# Patient Record
Sex: Female | Born: 1935 | Race: White | Hispanic: No | Marital: Married | Smoking: Former smoker
Health system: Southern US, Community
[De-identification: ages and names within clinical notes are randomized; demographics above are authoritative.]

## PROBLEM LIST (undated history)

## (undated) DIAGNOSIS — I4891 Unspecified atrial fibrillation: Secondary | ICD-10-CM

## (undated) DIAGNOSIS — I82409 Acute embolism and thrombosis of unspecified deep veins of unspecified lower extremity: Secondary | ICD-10-CM

## (undated) HISTORY — PX: OTHER SURGICAL HISTORY: SHX169

## (undated) HISTORY — PX: APPENDECTOMY: SHX54

## (undated) HISTORY — PX: BREAST SURGERY: SHX581

---

## 2014-07-07 DIAGNOSIS — E785 Hyperlipidemia, unspecified: Secondary | ICD-10-CM | POA: Diagnosis present

## 2014-07-07 DIAGNOSIS — K219 Gastro-esophageal reflux disease without esophagitis: Secondary | ICD-10-CM | POA: Diagnosis present

## 2014-07-07 DIAGNOSIS — M5416 Radiculopathy, lumbar region: Secondary | ICD-10-CM | POA: Diagnosis present

## 2014-10-04 DIAGNOSIS — J4489 Other specified chronic obstructive pulmonary disease: Secondary | ICD-10-CM | POA: Diagnosis present

## 2015-10-14 ENCOUNTER — Emergency Department (HOSPITAL_BASED_OUTPATIENT_CLINIC_OR_DEPARTMENT_OTHER)
Admission: EM | Admit: 2015-10-14 | Discharge: 2015-10-14 | Disposition: A | Discharge status: Home / Self Care | Payer: Medicare Other | Attending: Emergency Medicine | Admitting: Emergency Medicine

## 2015-10-14 ENCOUNTER — Ambulatory Visit (HOSPITAL_BASED_OUTPATIENT_CLINIC_OR_DEPARTMENT_OTHER)
Admission: RE | Admit: 2015-10-14 | Discharge: 2015-10-14 | Disposition: A | Discharge status: Home / Self Care | Payer: Medicare Other | Source: Ambulatory Visit | Attending: Emergency Medicine | Admitting: Emergency Medicine

## 2015-10-14 ENCOUNTER — Encounter (HOSPITAL_BASED_OUTPATIENT_CLINIC_OR_DEPARTMENT_OTHER): Payer: Self-pay | Admitting: *Deleted

## 2015-10-14 DIAGNOSIS — I82411 Acute embolism and thrombosis of right femoral vein: Secondary | ICD-10-CM | POA: Insufficient documentation

## 2015-10-14 DIAGNOSIS — Z86718 Personal history of other venous thrombosis and embolism: Secondary | ICD-10-CM

## 2015-10-14 DIAGNOSIS — I82431 Acute embolism and thrombosis of right popliteal vein: Secondary | ICD-10-CM | POA: Insufficient documentation

## 2015-10-14 DIAGNOSIS — M79661 Pain in right lower leg: Secondary | ICD-10-CM

## 2015-10-14 DIAGNOSIS — M79604 Pain in right leg: Secondary | ICD-10-CM | POA: Insufficient documentation

## 2015-10-14 DIAGNOSIS — Z87891 Personal history of nicotine dependence: Secondary | ICD-10-CM | POA: Insufficient documentation

## 2015-10-14 DIAGNOSIS — I82811 Embolism and thrombosis of superficial veins of right lower extremities: Secondary | ICD-10-CM

## 2015-10-14 HISTORY — DX: Acute embolism and thrombosis of unspecified deep veins of unspecified lower extremity: I82.409

## 2015-10-14 MED ORDER — XARELTO VTE STARTER PACK 15 & 20 MG PO TBPK: 15.0000 mg | ORAL_TABLET | ORAL | Status: DC

## 2015-10-14 NOTE — ED Notes (Signed)
Presents with 1 week pain in rt leg, states has hx of DVT in left lower extremity

## 2015-10-14 NOTE — ED Provider Notes (Signed)
CSN: [id number]     Arrival date & time 10/14/15  [id number] History   First MD Initiated Contact with Patient 10/14/15 [phone]     Chief Complaint  Patient presents with  . Leg Pain     (Consider location/radiation/quality/duration/timing/severity/associated sxs/prior Treatment) HPI  80 year old female presents with right calf pain and swelling that started 8 days ago. She feels knots in her leg. Patient states she has felt mild warmth. Denies fevers or chills. No chest pain or shortness of breath. The area seems to be improving but she went to go see the doctor at her facility and they recommended she come get a Doppler to rule out DVT. Years ago she had an arterial blockage in her left leg but this feels way different and not nearly as severe. She accepts thinks that one of the knots has gone down and is gone but she wanted to get it checked out. No weakness or numbness in the extremity.  Past Medical History  Diagnosis Date  . DVT (deep venous thrombosis) [facility](HCC)    Past Surgical History  Procedure Laterality Date  . Spleenectomy    . Appendectomy    . Breast surgery     History reviewed. No pertinent family history. Social History  Substance Use Topics  . Smoking status: Former Games developermoker  . Smokeless tobacco: None  . Alcohol Use: 1.2 oz/week    2 Glasses of wine per week     Comment: 5 days a week   OB History    No data available     Review of Systems  Constitutional: Negative for fever.  Respiratory: Negative for shortness of breath.   Cardiovascular: Positive for leg swelling. Negative for chest pain.  Skin: Positive for color change.  All other systems reviewed and are negative.     Allergies  Codeine  Home Medications   Prior to Admission medications   Not on File   BP 124/66 mmHg  Pulse 76  Temp(Src) 97.2 F (36.2 C) (Oral)  Resp 18  Ht 5\' 5"  (1.651 m)  Wt 132 lb (59.875 kg)  BMI 21.97 kg/m2  SpO2 98% Physical Exam  Constitutional: She is oriented to  person, place, and time. She appears well-developed and well-nourished.  HENT:  Head: Normocephalic and atraumatic.  Right Ear: External ear normal.  Left Ear: External ear normal.  Nose: Nose normal.  Eyes: Right eye exhibits no discharge. Left eye exhibits no discharge.  Cardiovascular: Normal rate and intact distal pulses.   Pulses:      Dorsalis pedis pulses are 2+ on the right side, and 2+ on the left side.       Posterior tibial pulses are 2+ on the right side, and 2+ on the left side.  Pulmonary/Chest: Effort normal.  Abdominal: She exhibits no distension.  Musculoskeletal:       Right knee: No tenderness found.       Right upper leg: She exhibits no tenderness.       Right lower leg: She exhibits swelling.       Legs: Normal strength and sensation in bilateral lower extremities. No tenderness to knee or thigh  Neurological: She is alert and oriented to person, place, and time.  Skin: Skin is warm and dry.  Nursing note and vitals reviewed.   ED Course  Procedures (including critical care time) Labs Review Labs Reviewed - No data to display  Imaging Review No results found. I have personally reviewed and evaluated these images and lab  results as part of my medical decision-making.   EKG Interpretation None      MDM   Final diagnoses:  Right calf pain    Patient symptoms are most consistent with a superficial thrombophlebitis. Given improvement with no treatment I have low suspicion this is a cellulitis. There appears to be no abscess. She will need an ultrasound rule out DVT. At the time of seeing her, ultrasound is not come in to this department until 3 hours from now. Patient prefers not to wait, and will return at around 12:30 PM to get her ultrasound to rule out DVT. No chest symptoms to be concerned for a PE. Pulses normal, normal perfusion.    Pricilla Loveless, MD 10/14/15 [phone]

## 2015-10-14 NOTE — ED Provider Notes (Signed)
Received a call from the radiologist that the patient's DVT ultrasound does show a DVT in the popliteal and profunda femoral veins. Patient still does not have chest pain when I'm talking to her. She relates no known history of hypertension, renal failure, stroke, recent surgery or spinal surgery or history of significant bleeding. I discussed risks and benefits of blood thinners. She does want to be placed on a blood thinner, will start her on Xarelto. Discussed importance of following up closely with her PCP. Discussed that her bleeding will last longer and that she is high risk for intracranial injury or bleeding as well as other bleeding. Discussed any head injury needs to be evaluated by a doctor and she still prefers to do the blood thinner. Discussed other return precautions and have given her prescription for her medicine.   No results found for this or any previous visit. [place] Venous Img Lower Unilateral Right  10/14/2015  CLINICAL DATA:  Right lower extremity pain. History of prior deep venous thrombosis on left side EXAM: RIGHT LOWER EXTREMITY VENOUS DUPLEX ULTRASOUND TECHNIQUE: Gray-scale sonography with graded compression, as well as color Doppler and duplex ultrasound were performed to evaluate the right lower extremity deep venous system from the level of the common femoral vein and including the common femoral, femoral, profunda femoral, popliteal and calf veins including the posterior tibial, peroneal and gastrocnemius veins when visible. The superficial great saphenous vein was also interrogated. Spectral Doppler was utilized to evaluate flow at rest and with distal augmentation maneuvers in the common femoral, femoral and popliteal veins. COMPARISON:  None. FINDINGS: Contralateral Common Femoral Vein: Respiratory phasicity is normal and symmetric with the symptomatic side. No evidence of thrombus. Normal compressibility. Common Femoral Vein: No evidence of thrombus. Normal compressibility,  respiratory phasicity and response to augmentation. Saphenofemoral Junction: No evidence of thrombus. Normal compressibility and flow on color Doppler imaging. Profunda Femoral Vein: There is acute appearing thrombus in the right profunda femoral vein. There is no compression or augmentation in this vessel. There is no demonstrable Doppler signal. Femoral Vein: No evidence of thrombus. Normal compressibility, respiratory phasicity and response to augmentation. Popliteal Vein: There is acute appearing thrombus throughout this vessel. There is no appreciable Doppler signal. No compression and augmentation in this vessel is appreciated. Calf Veins: No evidence of thrombus. Normal compressibility and flow on color Doppler imaging. Superficial Great Saphenous Vein: No evidence of thrombus. Normal compressibility and flow on color Doppler imaging. Venous Reflux:  None. Other Findings: There is acute appearing thrombus in the right gastric vein. IMPRESSION: Acute appearing deep venous thrombosis in the popliteal and profunda femoral veins. There is also acute appearing thrombus in the right gastric pain, a superficial structure. No other areas of thrombus appreciable. Left common femoral vein appears patent. These results were called by telephone at the time of interpretation on 10/14/2015 at 3:05 pm to Dr. Pricilla LovelessSCOTT Jazon Jipson , who verbally acknowledged these results. Electronically Signed   By: Bretta BangWilliam  Woodruff III M.D.   On: 10/14/2015 15:05      Pricilla LovelessScott Oluwademilade Mckiver, MD 10/14/15 1525

## 2015-10-14 NOTE — ED Notes (Signed)
Denies any SOB or chest pain.

## 2015-10-14 NOTE — ED Notes (Signed)
Placed on cont pox monitoring with int NBP monitoring

## 2015-10-14 NOTE — ED Notes (Signed)
Presents today with c/o rt leg pain, states has "knots" in rt leg, is tender to touch. Has hx of clot in left leg, onset approx 1 week ago. Touching and walking increases the pain in the rt leg

## 2015-10-14 NOTE — Discharge Instructions (Signed)
Return to this emergency department at 12 PM or later for your ultrasound. If you develop any worsening pain, swelling, or develop chest pain or shortness of breath then returned to this or any other emergent department as his possible for evaluation.

## 2015-10-14 NOTE — ED Notes (Signed)
1+ pitting edema noted in both ankle areas

## 2015-10-14 NOTE — ED Notes (Signed)
DC instructions provided to pt, understands to return back to this campus for additional testing

## 2016-08-08 DIAGNOSIS — Z86718 Personal history of other venous thrombosis and embolism: Secondary | ICD-10-CM

## 2016-08-09 DIAGNOSIS — I480 Paroxysmal atrial fibrillation: Secondary | ICD-10-CM | POA: Diagnosis present

## 2017-12-22 ENCOUNTER — Emergency Department (HOSPITAL_COMMUNITY): Payer: Medicare Other

## 2017-12-22 ENCOUNTER — Emergency Department (HOSPITAL_COMMUNITY)
Admission: EM | Admit: 2017-12-22 | Discharge: 2017-12-22 | Disposition: A | Discharge status: Home / Self Care | Payer: Medicare Other | Attending: Emergency Medicine | Admitting: Emergency Medicine

## 2017-12-22 ENCOUNTER — Other Ambulatory Visit: Payer: Self-pay

## 2017-12-22 ENCOUNTER — Encounter (HOSPITAL_COMMUNITY): Payer: Self-pay

## 2017-12-22 DIAGNOSIS — Z87891 Personal history of nicotine dependence: Secondary | ICD-10-CM | POA: Diagnosis not present

## 2017-12-22 DIAGNOSIS — Y93G1 Activity, food preparation and clean up: Secondary | ICD-10-CM | POA: Insufficient documentation

## 2017-12-22 DIAGNOSIS — S0990XA Unspecified injury of head, initial encounter: Secondary | ICD-10-CM | POA: Diagnosis present

## 2017-12-22 DIAGNOSIS — Z23 Encounter for immunization: Secondary | ICD-10-CM | POA: Diagnosis not present

## 2017-12-22 DIAGNOSIS — R55 Syncope and collapse: Secondary | ICD-10-CM | POA: Insufficient documentation

## 2017-12-22 DIAGNOSIS — Y92000 Kitchen of unspecified non-institutional (private) residence as  the place of occurrence of the external cause: Secondary | ICD-10-CM | POA: Diagnosis not present

## 2017-12-22 DIAGNOSIS — Z7901 Long term (current) use of anticoagulants: Secondary | ICD-10-CM | POA: Insufficient documentation

## 2017-12-22 DIAGNOSIS — S0101XA Laceration without foreign body of scalp, initial encounter: Secondary | ICD-10-CM | POA: Diagnosis not present

## 2017-12-22 DIAGNOSIS — Z86718 Personal history of other venous thrombosis and embolism: Secondary | ICD-10-CM | POA: Diagnosis not present

## 2017-12-22 DIAGNOSIS — Y999 Unspecified external cause status: Secondary | ICD-10-CM | POA: Diagnosis not present

## 2017-12-22 DIAGNOSIS — W01198A Fall on same level from slipping, tripping and stumbling with subsequent striking against other object, initial encounter: Secondary | ICD-10-CM | POA: Diagnosis not present

## 2017-12-22 LAB — BASIC METABOLIC PANEL
ANION GAP: 8 (ref 5–15)
BUN: 10 mg/dL (ref 6–20)
CALCIUM: 9.2 mg/dL (ref 8.9–10.3)
CHLORIDE: 108 mmol/L (ref 101–111)
CO2: 24 mmol/L (ref 22–32)
CREATININE: 0.63 mg/dL (ref 0.44–1.00)
GFR calc non Af Amer: >60 mL/min (ref >60)
Glucose, Bld: 122 mg/dL — ABNORMAL HIGH (ref 65–99)
Potassium: 4.3 mmol/L (ref 3.5–5.1)
SODIUM: 140 mmol/L (ref 135–145)

## 2017-12-22 LAB — CBC WITH DIFFERENTIAL/PLATELET
Basophils Absolute: 0 10*3/uL (ref 0.0–0.1)
Basophils Relative: 0 %
EOS ABS: 0 10*3/uL (ref 0.0–0.7)
EOS PCT: 0 %
HCT: 40.1 % (ref 36.0–46.0)
Hemoglobin: 13 g/dL (ref 12.0–15.0)
Lymphocytes Relative: 17 %
Lymphs Abs: 1.7 10*3/uL (ref 0.7–4.0)
MCH: 30.2 pg (ref 26.0–34.0)
MCHC: 32.4 g/dL (ref 30.0–36.0)
MCV: 93 fL (ref 78.0–100.0)
MONO ABS: 0.6 10*3/uL (ref 0.1–1.0)
Monocytes Relative: 6 %
NEUTROS PCT: 77 %
Neutro Abs: 7.8 10*3/uL — ABNORMAL HIGH (ref 1.7–7.7)
PLATELETS: 259 10*3/uL (ref 150–400)
RBC: 4.31 MIL/uL (ref 3.87–5.11)
RDW: 14.1 % (ref 11.5–15.5)
WBC: 10.1 10*3/uL (ref 4.0–10.5)

## 2017-12-22 LAB — I-STAT TROPONIN, ED: Troponin i, poc: 0 ng/mL (ref 0.00–0.08)

## 2017-12-22 MED ORDER — ONDANSETRON HCL 4 MG PO TABS: 4.0000 mg | ORAL_TABLET | Freq: Four times a day (QID) | ORAL | 0 refills | Status: DC

## 2017-12-22 MED ORDER — LIDOCAINE HCL (PF) 1 % IJ SOLN
30.0000 mL | Freq: Once | INTRAMUSCULAR | Status: AC
  Administered 2017-12-22: 30 mL via INTRADERMAL
  Filled 2017-12-22: qty 30

## 2017-12-22 MED ORDER — ACETAMINOPHEN 325 MG PO TABS
650.0000 mg | ORAL_TABLET | Freq: Once | ORAL | Status: AC
  Administered 2017-12-22: 650 mg via ORAL
  Filled 2017-12-22: qty 2

## 2017-12-22 MED ORDER — TETANUS-DIPHTH-ACELL PERTUSSIS 5-2.5-18.5 LF-MCG/0.5 IM SUSP
0.5000 mL | Freq: Once | INTRAMUSCULAR | Status: AC
  Administered 2017-12-22: 0.5 mL via INTRAMUSCULAR
  Filled 2017-12-22: qty 0.5

## 2017-12-22 MED ORDER — ONDANSETRON HCL 4 MG PO TABS
4.0000 mg | ORAL_TABLET | Freq: Once | ORAL | Status: AC
  Administered 2017-12-22: 4 mg via ORAL
  Filled 2017-12-22: qty 1

## 2017-12-22 NOTE — ED Notes (Signed)
Pt ambulated well in the hall and back to room. Pt stated that she felt good and did not want to get back in bed and that she wanted to sit in the chair.

## 2017-12-22 NOTE — Discharge Instructions (Signed)
Your evaluated in the emergency department for a fainting spell in which she struck her head.  We placed staples to close the laceration and these will need to be removed in about a week.  You may shower.  Tylenol for pain.  Please follow-up with your doctor this week for reevaluation of this dizzy spell and possible fainting episode.  You should return to the emergency department if any worsening symptoms.

## 2017-12-22 NOTE — ED Triage Notes (Signed)
[name] coming from [name], she had an unwitnessed fall. Hx of syncope. Pt struck the wall and left a dent. Bleeding controlled to the back of the head. + for LOC. Pt alert and oriented X4.

## 2017-12-22 NOTE — ED Provider Notes (Signed)
[facility] EMERGENCY DEPARTMENT Provider Note   CSN: [id number] Arrival date & time: 12/22/17  0913     History   Chief Complaint Chief Complaint  Patient presents with  . Fall    HPI Katherine Porter is a 83 y.o. female.  The history is provided by the patient, the EMS personnel and the spouse.  Fall  This is a new problem. The current episode started less than 1 hour ago. The problem has been resolved. Associated symptoms include headaches. Pertinent negatives include no chest pain, no abdominal pain and no shortness of breath. Nothing aggravates the symptoms. Nothing relieves the symptoms. She has tried nothing for the symptoms. The treatment provided no relief.    83 year old female on Xarelto for A. fib.  She states she has been on and off dizzy for a few weeks.  Today in the kitchen making coffee she had an unwitnessed fall.  Her husband states she fell over hit the back of her head on the corner of the wall and was unconscious for less than a minute.  Currently now she is complaining of some head pain but otherwise no other symptoms or complaints of injury.  There is no blurry vision no numbness no tingling no chest pain no shortness of breath.  Past Medical History:  Diagnosis Date  . DVT (deep venous thrombosis) (HCC)     There are no active problems to display for this patient.   Past Surgical History:  Procedure Laterality Date  . APPENDECTOMY    . BREAST SURGERY    . spleenectomy      OB History    No data available       Home Medications    Prior to Admission medications   Medication Sig Start Date End Date Taking? Authorizing Provider  XARELTO STARTER PACK 15 & 20 MG TBPK Take 15-20 mg by mouth as directed. Take as directed on package: Start with one 15mg  tablet by mouth twice a day with food. On Day 22, switch to one 20mg  tablet once a day with food. 10/14/15   Pricilla Loveless, MD    Family History No family history on  file.  Social History Social History   Tobacco Use  . Smoking status: Former Games developer  . Smokeless tobacco: Never Used  Substance Use Topics  . Alcohol use: Yes    Alcohol/week: 1.2 oz    Types: 2 Glasses of wine per week    Comment: 5 days a week  . Drug use: No     Allergies   Codeine   Review of Systems Review of Systems  Constitutional: Negative for chills and fever.  HENT: Negative for ear pain and sore throat.   Eyes: Negative for pain and visual disturbance.  Respiratory: Negative for cough and shortness of breath.   Cardiovascular: Negative for chest pain and palpitations.  Gastrointestinal: Negative for abdominal pain and vomiting.  Genitourinary: Negative for dysuria and hematuria.  Musculoskeletal: Positive for back pain. Negative for arthralgias.  Skin: Negative for color change and rash.  Neurological: Positive for dizziness and headaches. Negative for seizures and syncope.  All other systems reviewed and are negative.    Physical Exam Updated Vital Signs There were no vitals taken for this visit.  Physical Exam  Constitutional: She is oriented to person, place, and time. She appears well-developed and well-nourished. No distress.  HENT:  Head: Normocephalic.  Mouth/Throat: Oropharynx is clear and moist.  7 cm posterior scalp laceration with matted  blood. No palpable skull depression.   Eyes: Conjunctivae are normal.  Neck: Neck supple.  No step-off or tenderness.  Cardiovascular: Normal rate and regular rhythm.  No murmur heard. Pulmonary/Chest: Effort normal and breath sounds normal. No respiratory distress.  Abdominal: Soft. There is no tenderness. There is no guarding.  Musculoskeletal: Normal range of motion. She exhibits no edema, tenderness or deformity.  Neurological: She is alert and oriented to person, place, and time. No cranial nerve deficit or sensory deficit. She exhibits normal muscle tone.  Skin: Skin is warm and dry. Capillary refill  takes less than 2 seconds.  Psychiatric: She has a normal mood and affect.  Nursing note and vitals reviewed.    ED Treatments / Results  Labs (all labs ordered are listed, but only abnormal results are displayed) Labs Reviewed  BASIC METABOLIC PANEL - Abnormal; Notable for the following components:      Result Value   Glucose, Bld 122 (*)    All other components within normal limits  CBC WITH DIFFERENTIAL/PLATELET - Abnormal; Notable for the following components:   Neutro Abs 7.8 (*)    All other components within normal limits  I-STAT TROPONIN, ED    EKG  EKG Interpretation  Date/Time:  Monday December 22 2017 09:24:18 EST Ventricular Rate:  73 PR Interval:    QRS Duration: 87 QT Interval:  416 QTC Calculation: 459 R Axis:   67 Text Interpretation:  Sinus rhythm Nonspecific T abnormalities, lateral leads no prior to compare with Confirmed by Meridee Score [phone]) on 12/22/2017 9:28:17 AM Also confirmed by Meridee Score [phone]), editor Sheppard Evens ([id number])  on 12/22/2017 10:25:06 AM       Radiology Ct Head Wo Contrast  Result Date: 12/22/2017 CLINICAL DATA:  Syncope and fall today with a blow to the back of the head. Laceration. Initial encounter. EXAM: CT HEAD WITHOUT CONTRAST CT CERVICAL SPINE WITHOUT CONTRAST TECHNIQUE: Multidetector CT imaging of the head and cervical spine was performed following the standard protocol without intravenous contrast. Multiplanar CT image reconstructions of the cervical spine were also generated. COMPARISON:  None. FINDINGS: CT HEAD FINDINGS Brain: There is some cortical atrophy and chronic microvascular ischemic change. No acute abnormality including hemorrhage, infarct, mass lesion, mass effect, midline shift or abnormal extra-axial fluid collection. No hydrocephalus or pneumocephalus. Vascular: Atherosclerosis noted. Skull: Intact. Sinuses/Orbits: There is partial visualization of mucosal thickening in the right maxillary sinus. The  patient is status post bilateral lens extraction. Other: Laceration and bandaging in the posterior aspect of the head noted. CT CERVICAL SPINE FINDINGS Alignment: Trace facet mediated anterolisthesis C4 on C5 is identified. Skull base and vertebrae: No acute fracture. No primary bone lesion or focal pathologic process. Soft tissues and spinal canal: No prevertebral fluid or swelling. No visible canal hematoma. Disc levels: Loss of disc space height and endplate spurring are seen at C5-6 and C6-7. Degenerative change about the articulation of the odontoid process and anterior arch of C1 noted. Upper chest: There is some scarring in the lung apices. Other: Sclerotic lesion in the left first rib 0.5 cm in diameter is presumably a bone island. The left lobe of the thyroid is enlarged and appears heterogeneous without focal lesion. IMPRESSION: Posterior scalp laceration. Negative for fracture or acute intracranial abnormality. No acute abnormality cervical spine. Atrophy and chronic microvascular ischemic change. Atherosclerosis. Degenerative disc disease C5-6 and C6-7. 0.5 cm sclerotic lesion in the left first rib is presumably a bone island in the absence of a history of  cancer. Partial visualization of mucosal thickening right maxillary sinus. Electronically Signed   By: Drusilla Kanner M.D.   On: 12/22/2017 10:07   Ct Cervical Spine Wo Contrast  Result Date: 12/22/2017 CLINICAL DATA:  Syncope and fall today with a blow to the back of the head. Laceration. Initial encounter. EXAM: CT HEAD WITHOUT CONTRAST CT CERVICAL SPINE WITHOUT CONTRAST TECHNIQUE: Multidetector CT imaging of the head and cervical spine was performed following the standard protocol without intravenous contrast. Multiplanar CT image reconstructions of the cervical spine were also generated. COMPARISON:  None. FINDINGS: CT HEAD FINDINGS Brain: There is some cortical atrophy and chronic microvascular ischemic change. No acute abnormality including  hemorrhage, infarct, mass lesion, mass effect, midline shift or abnormal extra-axial fluid collection. No hydrocephalus or pneumocephalus. Vascular: Atherosclerosis noted. Skull: Intact. Sinuses/Orbits: There is partial visualization of mucosal thickening in the right maxillary sinus. The patient is status post bilateral lens extraction. Other: Laceration and bandaging in the posterior aspect of the head noted. CT CERVICAL SPINE FINDINGS Alignment: Trace facet mediated anterolisthesis C4 on C5 is identified. Skull base and vertebrae: No acute fracture. No primary bone lesion or focal pathologic process. Soft tissues and spinal canal: No prevertebral fluid or swelling. No visible canal hematoma. Disc levels: Loss of disc space height and endplate spurring are seen at C5-6 and C6-7. Degenerative change about the articulation of the odontoid process and anterior arch of C1 noted. Upper chest: There is some scarring in the lung apices. Other: Sclerotic lesion in the left first rib 0.5 cm in diameter is presumably a bone island. The left lobe of the thyroid is enlarged and appears heterogeneous without focal lesion. IMPRESSION: Posterior scalp laceration. Negative for fracture or acute intracranial abnormality. No acute abnormality cervical spine. Atrophy and chronic microvascular ischemic change. Atherosclerosis. Degenerative disc disease C5-6 and C6-7. 0.5 cm sclerotic lesion in the left first rib is presumably a bone island in the absence of a history of cancer. Partial visualization of mucosal thickening right maxillary sinus. Electronically Signed   By: Drusilla Kanner M.D.   On: 12/22/2017 10:07    Procedures .Marland KitchenLaceration Repair Date/Time: 12/22/2017 11:37 AM Performed by: Terrilee Files, MD Authorized by: Terrilee Files, MD   Consent:    Consent obtained:  Verbal   Consent given by:  Patient   Risks discussed:  Infection, pain, poor cosmetic result and poor wound healing   Alternatives  discussed:  No treatment and delayed treatment Anesthesia (see MAR for exact dosages):    Anesthesia method:  Local infiltration   Local anesthetic:  Lidocaine 1% w/o epi Laceration details:    Location:  Scalp   Scalp location:  Occipital   Length (cm):  8 Repair type:    Repair type:  Simple Pre-procedure details:    Preparation:  Patient was prepped and draped in usual sterile fashion and imaging obtained to evaluate for foreign bodies Treatment:    Area cleansed with:  Shur-Clens and saline   Amount of cleaning:  Standard Skin repair:    Repair method:  Staples   Number of staples:  7 Approximation:    Approximation:  Close   Vermilion border: well-aligned   Post-procedure details:    Dressing:  Open (no dressing)   Patient tolerance of procedure:  Tolerated well, no immediate complications   (including critical care time)  Medications Ordered in ED Medications - No data to display   Initial Impression / Assessment and Plan / ED Course  I have  reviewed the triage vital signs and the nursing notes.  Pertinent labs & imaging results that were available during my care of the patient were reviewed by me and considered in my medical decision making (see chart for details).     Patient with dizziness, syncopy with head injury, on anticoagulation. Exam benign except for scalp lac, repaired. Imaging unremarkable. Ambulatory here without difficulty. Mild nausea controlled with zofran. She is comfortable going home, will followup with pcp for further workup. Has had syncope before.   Final Clinical Impressions(s) / ED Diagnoses   Final diagnoses:  Laceration of scalp, initial encounter  Syncope and collapse    ED Discharge Orders        Ordered    ondansetron (ZOFRAN) 4 MG tablet  Every 6 hours     12/22/17 1402       Terrilee FilesButler, Viren Lebeau C, MD 12/23/17 1051

## 2019-03-08 IMAGING — CT CT CERVICAL SPINE W/O CM
3 of 4 series · 14 of 33 positions shown, 17 images · non-contrast
Comparison: None.

CLINICAL DATA: Syncope and fall today with a blow to the back of
the head. Laceration. Initial encounter.

EXAM:
CT HEAD WITHOUT CONTRAST
CT CERVICAL SPINE WITHOUT CONTRAST
TECHNIQUE: Multidetector CT imaging of the head and cervical spine was
performed following the standard protocol without intravenous
contrast. Multiplanar CT image reconstructions of the cervical spine
were also generated.

[Series 4: head bone · axial · 0.39mm/px · z∈[-92,+24]mm · 6 of 76 slices shown, 8 images]
[im 9/76  soft-tissue]
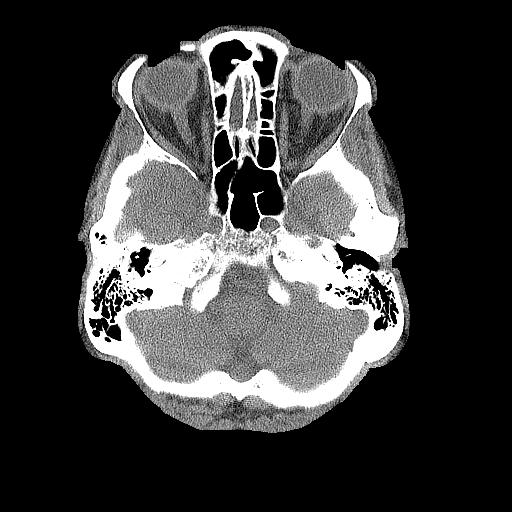
[im 9/76  bone]
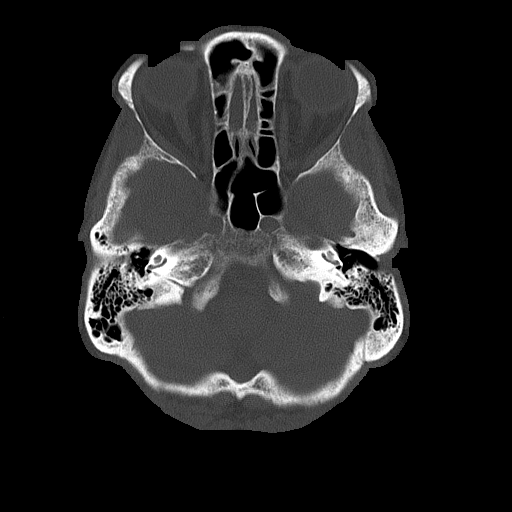
[im 26/76  bone]
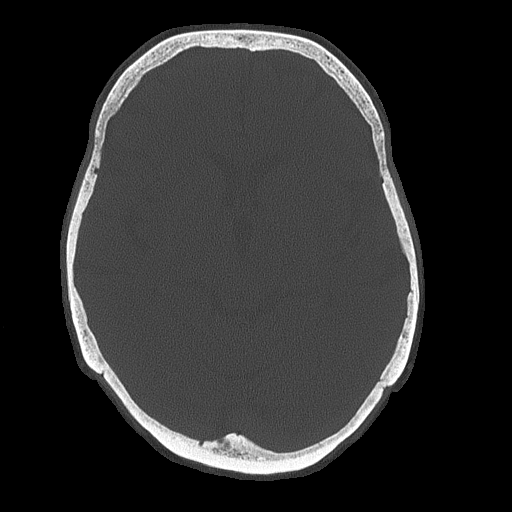
[im 34/76  bone]
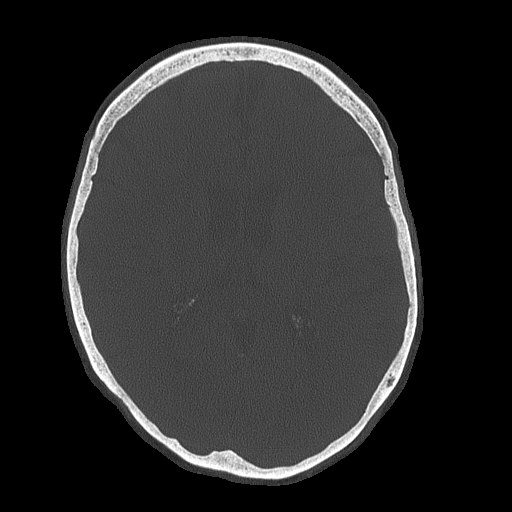
[im 42/76  bone]
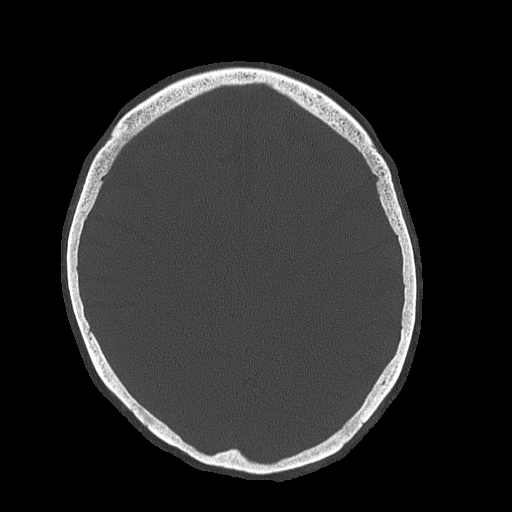
[im 59/76  soft-tissue]
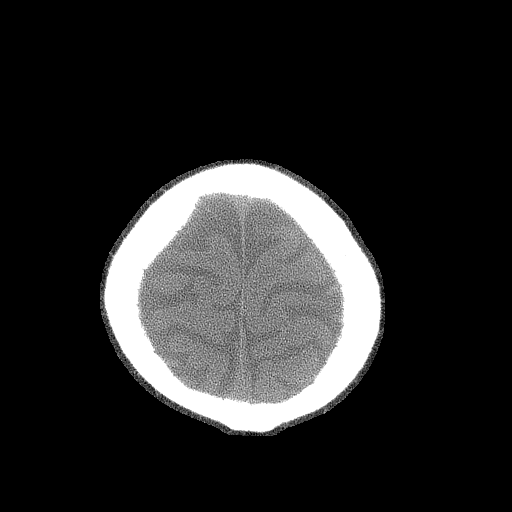
[im 59/76  bone]
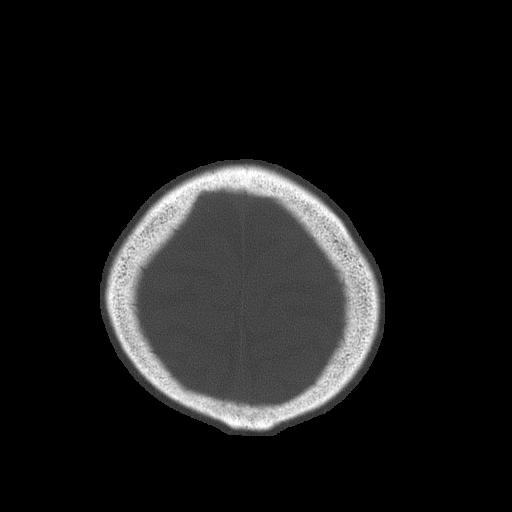
[im 67/76  bone]
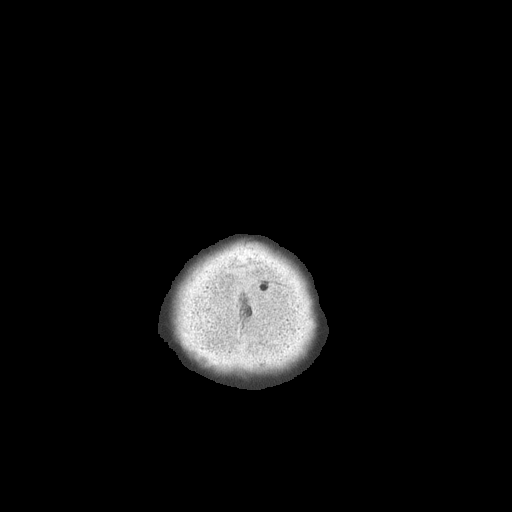

[Series 5: head without cor · coronal · non-contrast · 0.29mm/px · 3 of 67 slices shown]
[im 14/67  bone]
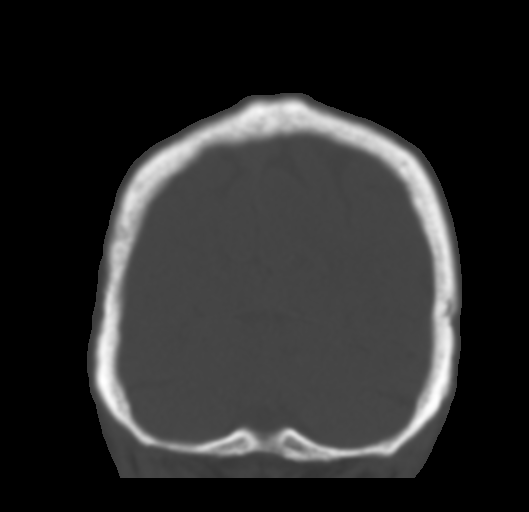
[im 27/67  bone]
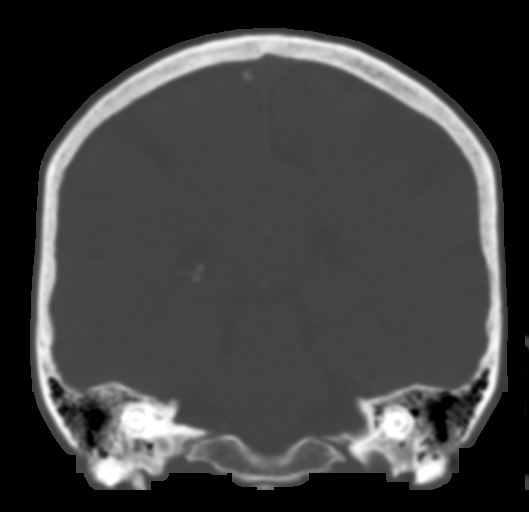
[im 40/67  bone]
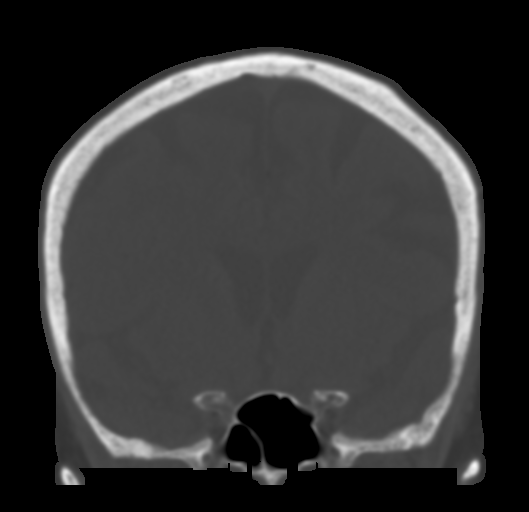

[Series 6: head without sag · sagittal · non-contrast · 0.30mm/px · 5 of 66 slices shown, 6 images]
[im 22/66  bone]
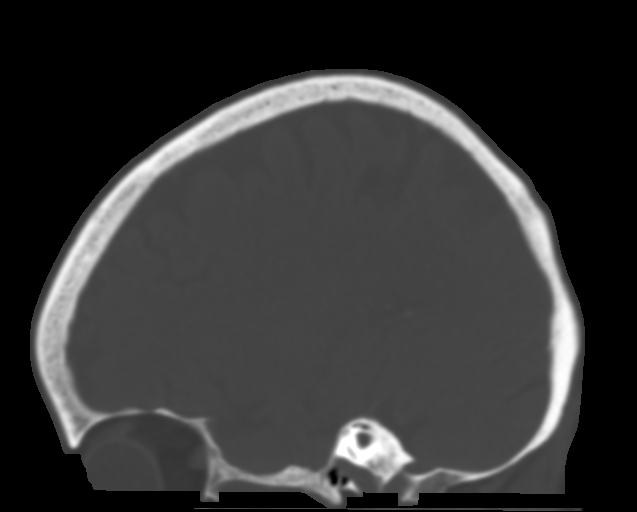
[im 28/66  bone]
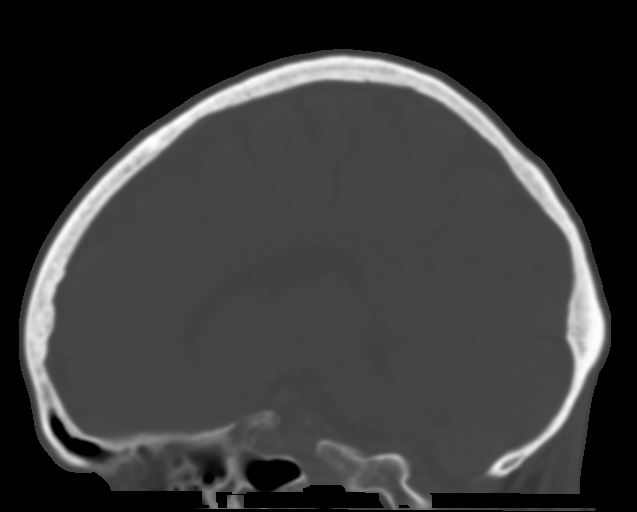
[im 33/66  soft-tissue]
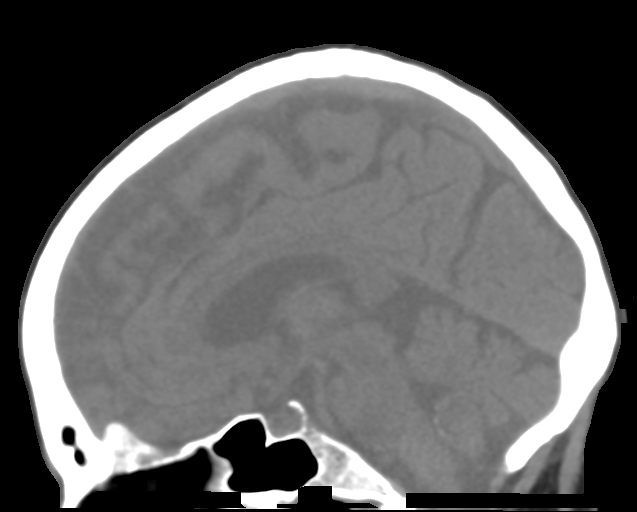
[im 33/66  bone]
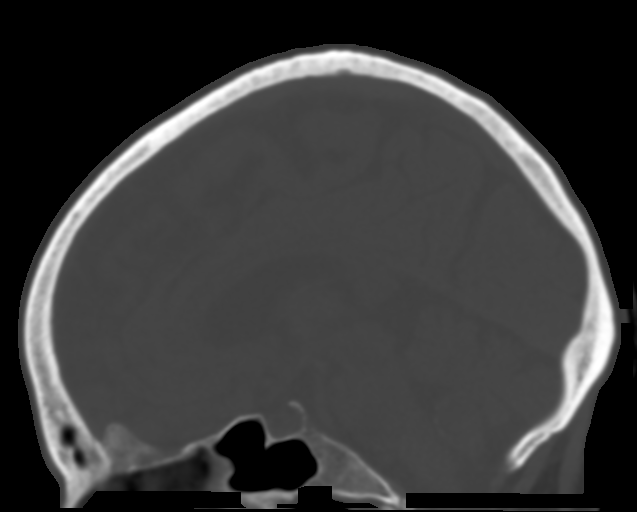
[im 38/66  bone]
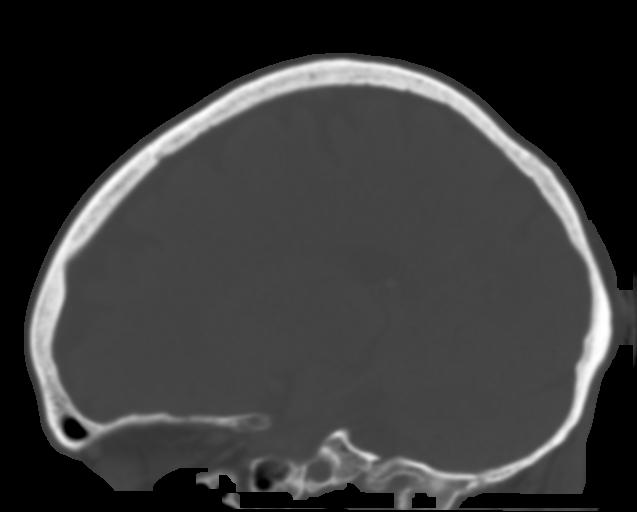
[im 44/66  bone]
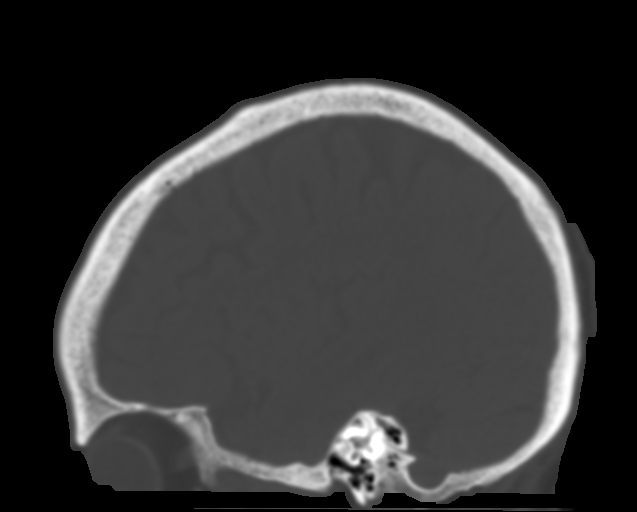

[14 of 33 positions shown; findings below may reference images not displayed]

FINDINGS: CT HEAD FINDINGS

Brain: There is some cortical atrophy and chronic microvascular
ischemic change. No acute abnormality including hemorrhage, infarct,
mass lesion, mass effect, midline shift or abnormal extra-axial
fluid collection. No hydrocephalus or pneumocephalus.

Vascular: Atherosclerosis noted.

Skull: Intact.

Sinuses/Orbits: There is partial visualization of mucosal thickening
in the right maxillary sinus. The patient is status post bilateral
lens extraction.

Other: Laceration and bandaging in the posterior aspect of the head
noted.

CT CERVICAL SPINE FINDINGS

Alignment: Trace facet mediated anterolisthesis C4 on C5 is
identified.

Skull base and vertebrae: No acute fracture. No primary bone lesion
or focal pathologic process.

Soft tissues and spinal canal: No prevertebral fluid or swelling. No
visible canal hematoma.

Disc levels: Loss of disc space height and endplate spurring are
seen at C5-6 and C6-7. Degenerative change about the articulation of
the odontoid process and anterior arch of C1 noted.

Upper chest: There is some scarring in the lung apices.

Other: Sclerotic lesion in the left first rib 0.5 cm in diameter is
presumably a bone island. The left lobe of the thyroid is enlarged
and appears heterogeneous without focal lesion.
IMPRESSION: Posterior scalp laceration. Negative for fracture or acute
intracranial abnormality.

No acute abnormality cervical spine.

Atrophy and chronic microvascular ischemic change.

Atherosclerosis.

Degenerative disc disease C5-6 and C6-7.

0.5 cm sclerotic lesion in the left first rib is presumably a bone
island in the absence of a history of cancer.

Partial visualization of mucosal thickening right maxillary sinus.

## 2019-04-05 DIAGNOSIS — I10 Essential (primary) hypertension: Secondary | ICD-10-CM | POA: Diagnosis present

## 2019-08-21 ENCOUNTER — Emergency Department (HOSPITAL_BASED_OUTPATIENT_CLINIC_OR_DEPARTMENT_OTHER)
Admission: EM | Admit: 2019-08-21 | Discharge: 2019-08-21 | Disposition: A | Discharge status: Home / Self Care | Payer: Medicare Other | Attending: Emergency Medicine | Admitting: Emergency Medicine

## 2019-08-21 ENCOUNTER — Emergency Department (HOSPITAL_BASED_OUTPATIENT_CLINIC_OR_DEPARTMENT_OTHER): Payer: Medicare Other

## 2019-08-21 ENCOUNTER — Encounter (HOSPITAL_BASED_OUTPATIENT_CLINIC_OR_DEPARTMENT_OTHER): Payer: Self-pay | Admitting: Emergency Medicine

## 2019-08-21 ENCOUNTER — Other Ambulatory Visit: Payer: Self-pay

## 2019-08-21 DIAGNOSIS — S8002XA Contusion of left knee, initial encounter: Secondary | ICD-10-CM | POA: Insufficient documentation

## 2019-08-21 DIAGNOSIS — Z885 Allergy status to narcotic agent status: Secondary | ICD-10-CM | POA: Insufficient documentation

## 2019-08-21 DIAGNOSIS — Z79899 Other long term (current) drug therapy: Secondary | ICD-10-CM | POA: Insufficient documentation

## 2019-08-21 DIAGNOSIS — Z87891 Personal history of nicotine dependence: Secondary | ICD-10-CM | POA: Insufficient documentation

## 2019-08-21 DIAGNOSIS — S8264XA Nondisplaced fracture of lateral malleolus of right fibula, initial encounter for closed fracture: Secondary | ICD-10-CM | POA: Insufficient documentation

## 2019-08-21 DIAGNOSIS — S82891A Other fracture of right lower leg, initial encounter for closed fracture: Secondary | ICD-10-CM

## 2019-08-21 DIAGNOSIS — I4891 Unspecified atrial fibrillation: Secondary | ICD-10-CM | POA: Insufficient documentation

## 2019-08-21 DIAGNOSIS — S99911A Unspecified injury of right ankle, initial encounter: Secondary | ICD-10-CM | POA: Diagnosis present

## 2019-08-21 DIAGNOSIS — Z86718 Personal history of other venous thrombosis and embolism: Secondary | ICD-10-CM | POA: Insufficient documentation

## 2019-08-21 DIAGNOSIS — S60222A Contusion of left hand, initial encounter: Secondary | ICD-10-CM | POA: Diagnosis not present

## 2019-08-21 DIAGNOSIS — S20212A Contusion of left front wall of thorax, initial encounter: Secondary | ICD-10-CM | POA: Diagnosis not present

## 2019-08-21 DIAGNOSIS — Y9301 Activity, walking, marching and hiking: Secondary | ICD-10-CM | POA: Insufficient documentation

## 2019-08-21 DIAGNOSIS — Z7901 Long term (current) use of anticoagulants: Secondary | ICD-10-CM | POA: Insufficient documentation

## 2019-08-21 DIAGNOSIS — Y999 Unspecified external cause status: Secondary | ICD-10-CM | POA: Diagnosis not present

## 2019-08-21 DIAGNOSIS — Z886 Allergy status to analgesic agent status: Secondary | ICD-10-CM | POA: Insufficient documentation

## 2019-08-21 DIAGNOSIS — W19XXXA Unspecified fall, initial encounter: Secondary | ICD-10-CM

## 2019-08-21 DIAGNOSIS — W010XXA Fall on same level from slipping, tripping and stumbling without subsequent striking against object, initial encounter: Secondary | ICD-10-CM | POA: Insufficient documentation

## 2019-08-21 DIAGNOSIS — Y929 Unspecified place or not applicable: Secondary | ICD-10-CM | POA: Diagnosis not present

## 2019-08-21 HISTORY — DX: Unspecified atrial fibrillation: I48.91

## 2019-08-21 MED ORDER — ACETAMINOPHEN 500 MG PO TABS
1000.0000 mg | ORAL_TABLET | Freq: Once | ORAL | Status: AC
  Administered 2019-08-21: 1000 mg via ORAL

## 2019-08-21 MED ORDER — ACETAMINOPHEN 500 MG PO TABS
ORAL_TABLET | ORAL | Status: AC
  Filled 2019-08-21: qty 2

## 2019-08-21 NOTE — ED Provider Notes (Signed)
[facility] EMERGENCY DEPARTMENT Provider Note   CSN: [id number] Arrival date & time: 08/21/19  1139     History   Chief Complaint Chief Complaint  Patient presents with   Fall    HPI Katherine Porter is a 84 y.o. female.  She is on anticoagulation for A. fib.  She is complaining of a mechanical slip and fall when she was out for a walk today.  She said she twisted her right ankle and fell to the ground.  Denies striking her head and no LOC.  She is complaining of pain in the left side of her chest, left hand, left knee, and right ankle.  No numbness or weakness.  No headache neck or back pain.  No abdominal pain.     The history is provided by the patient.  Fall This is a new problem. The current episode started 1 to 2 hours ago. The problem occurs rarely. The problem has not changed since onset.Associated symptoms include chest pain. Pertinent negatives include no abdominal pain, no headaches and no shortness of breath. The symptoms are aggravated by bending and twisting. The symptoms are relieved by position. She has tried nothing for the symptoms. The treatment provided no relief.    Past Medical History:  Diagnosis Date   Atrial fibrillation (HCC)    DVT (deep venous thrombosis) (HCC)     There are no active problems to display for this patient.   Past Surgical History:  Procedure Laterality Date   APPENDECTOMY     BREAST SURGERY     spleenectomy       OB History   No obstetric history on file.      Home Medications    Prior to Admission medications   Medication Sig Start Date End Date Taking? Authorizing Provider  calcium carbonate (TUMS) 500 MG chewable tablet Chew 500 mg by mouth daily.    [provider]  cycloSPORINE (RESTASIS) 0.05 % ophthalmic emulsion Place 1 drop into both eyes 2 (two) times daily.    [provider]  diltiazem (CARDIZEM CD) 120 MG 24 hr capsule Take 120 mg by mouth daily. 05/12/17   [provider]  gabapentin (NEURONTIN) 600 MG tablet Take 600 mg by mouth daily. 12/18/17   [provider]  Multiple Vitamins-Minerals (MULTIVITAMIN ADULT PO) Take 1 tablet by mouth daily.    [provider]  ondansetron (ZOFRAN) 4 MG tablet Take 1 tablet (4 mg total) by mouth every 6 (six) hours. 12/22/17   Terrilee FilesButler, Theodoros Stjames C, MD  XARELTO STARTER PACK 15 & 20 MG TBPK Take 15-20 mg by mouth as directed. Take as directed on package: Start with one 15mg  tablet by mouth twice a day with food. On Day 22, switch to one 20mg  tablet once a day with food. Patient taking differently: Take 20 mg by mouth daily. Take as directed on packag 10/14/15   Pricilla LovelessGoldston, Scott, MD    Family History No family history on file.  Social History Social History   Tobacco Use   Smoking status: Former Smoker   Smokeless tobacco: Never Used  Substance Use Topics   Alcohol use: Yes    Alcohol/week: 2.0 standard drinks    Types: 2 Glasses of wine per week    Comment: 5 days a week   Drug use: No     Allergies   Aspirin, Naproxen, Other, Tramadol, and Codeine   Review of Systems Review of Systems  Constitutional: Negative for fever.  HENT:  Negative for sore throat.   Eyes: Negative for visual disturbance.  Respiratory: Negative for shortness of breath.   Cardiovascular: Positive for chest pain.  Gastrointestinal: Negative for abdominal pain.  Genitourinary: Negative for dysuria.  Musculoskeletal: Negative for neck pain.  Skin: Negative for rash.  Neurological: Negative for headaches.     Physical Exam Updated Vital Signs BP 126/75 (BP Location: Right Arm)    Pulse 65    Temp 98.2 F (36.8 C) (Oral)    Resp 20    Ht 5\' 5"  (1.651 m)    Wt 65.8 kg    SpO2 97%    BMI 24.13 kg/m   Physical Exam Vitals signs and nursing note reviewed.  Constitutional:      General: She is not in acute distress.    Appearance: She is well-developed.  HENT:     Head: Normocephalic and atraumatic.    Eyes:     Conjunctiva/sclera: Conjunctivae normal.  Neck:     Musculoskeletal: Neck supple.  Cardiovascular:     Rate and Rhythm: Normal rate and regular rhythm.     Heart sounds: No murmur.  Pulmonary:     Effort: Pulmonary effort is normal. No respiratory distress.     Breath sounds: Normal breath sounds.  Abdominal:     Palpations: Abdomen is soft.     Tenderness: There is no abdominal tenderness.  Musculoskeletal:        General: Tenderness and signs of injury present. No deformity.     Comments: Right upper extremity full range of motion with no pain or limitations.  Left upper extremity full range of motion, tender in her left palm.  Right lower extremity nontender hip and knee but marked tenderness ankle bimalleolar with associated swelling.  Distal pulses sensation and motor intact.  Left lower extremity diffuse tenderness around left knee.  Well-healed surgical scar.  Hip ankle nontender full range of motion.  Distal pulses sensation motor intact.  Neck and back nontender.  Skin:    General: Skin is warm and dry.     Capillary Refill: Capillary refill takes less than 2 seconds.  Neurological:     General: No focal deficit present.     Mental Status: She is alert.     Sensory: No sensory deficit.     Motor: No weakness.      ED Treatments / Results  Labs (all labs ordered are listed, but only abnormal results are displayed) Labs Reviewed - No data to display  EKG None  Radiology Dg Chest 2 View  Result Date: 08/21/2019 CLINICAL DATA:  Left-sided chest pain EXAM: CHEST - 2 VIEW COMPARISON:  07/23/2016 FINDINGS: The heart size and mediastinal contours are within normal limits. Calcific aortic knob both lungs are clear. No acute osseous abnormality. IMPRESSION: No active cardiopulmonary disease. Electronically Signed   By: 07/25/2016 M.D.   On: 08/21/2019 12:52   Dg Ankle Complete Right  Result Date: 08/21/2019 CLINICAL DATA:  Left ankle pain and swelling  EXAM: RIGHT ANKLE - COMPLETE 3+ VIEW COMPARISON:  None. FINDINGS: Subtle cortical irregularity at the inferior tip of the lateral malleolus and adjacent lateral talus suggesting nondisplaced fractures. There is marked overlying soft tissue swelling about the lateral ankle. Rounded 3 mm lucency noted at the lateral talar shoulder suggesting osteochondral defect. Ankle mortise appears congruent without widening. IMPRESSION: 1. Findings suspicious for nondisplaced fractures of the lateral malleolar tip and lateral talus. 2. Probable 3 mm lateral talar shoulder OCL. 3. Marked soft  tissue swelling overlying the lateral ankle. Electronically Signed   By: Davina Poke M.D.   On: 08/21/2019 12:55   Ct Head Wo Contrast  Result Date: 08/21/2019 CLINICAL DATA:  Fall, on blood thinners EXAM: CT HEAD WITHOUT CONTRAST TECHNIQUE: Contiguous axial images were obtained from the base of the skull through the vertex without intravenous contrast. COMPARISON:  CT head dated 12/22/2017 FINDINGS: Brain: No evidence of acute infarction, hemorrhage, hydrocephalus, extra-axial collection or mass lesion/mass effect. Subcortical white matter and periventricular small vessel ischemic changes. Age related atrophy. Vascular: Intracranial atherosclerosis. Skull: Normal. Negative for fracture or focal lesion. Sinuses/Orbits: The visualized paranasal sinuses are essentially clear. The mastoid air cells are unopacified. Other: None. IMPRESSION: No evidence of acute intracranial abnormality. Atrophy with small vessel ischemic changes. Electronically Signed   By: Julian Hy M.D.   On: 08/21/2019 12:58    Procedures Procedures (including critical care time)  Medications Ordered in ED Medications  acetaminophen (TYLENOL) tablet 1,000 mg (1,000 mg Oral Given 08/21/19 1352)     Initial Impression / Assessment and Plan / ED Course  I have reviewed the triage vital signs and the nursing notes.  Pertinent labs & imaging results  that were available during my care of the patient were reviewed by me and considered in my medical decision making (see chart for details).  Clinical Course as of Aug 20 1658  Sat Aug 20, 6053  7029 84 year old female with mechanical fall here for evaluation of injuries.  Differential includes fracture, dislocation, contusion, sprain.  Due to her being on Eliquis we will get a CT of her head.  Plain film x-rays of chest wrist ankle knee.   [MB]  1228 Received a call from radiology tech that Ms. Gadomski does not want her hand or her knee x-ray.   [MB]    Clinical Course User Index [MB] Hayden Rasmussen, MD   Patient with probable fracture at tip of malleolus and talus. 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 Will place in cam boot and she has a walker to use. Instructions for followup and return given.       Final Clinical Impressions(s) / ED Diagnoses   Final diagnoses:  Fall, initial encounter  Closed fracture of right ankle, initial encounter  Contusion of left chest wall, initial encounter  Contusion of left hand, initial encounter  Contusion of left knee, initial encounter    ED Discharge Orders    None       Hayden Rasmussen, MD 08/21/19 1700

## 2019-08-21 NOTE — ED Notes (Signed)
ED Provider at bedside. 

## 2019-08-21 NOTE — Discharge Instructions (Signed)
You were seen in the emergency department for evaluation of injuries from a fall.  Your right ankle x-ray showed a likely fracture.  You were placed in a orthopedic boot.  It will be important for you to follow-up with your orthopedic specialist within the next week.  Please keep your ankle elevated and use ice for the first 24 hours.  Return to the emergency department if any worsening symptoms.

## 2019-08-21 NOTE — ED Triage Notes (Addendum)
Pt tripped and fell today. C/o R ankle pain, L rib pain, knee pain and hand pain. Bruising noted to L rib area, swelling noted to ankle. Pt takes Xarelto, denies hitting head or LOC

## 2020-09-09 ENCOUNTER — Other Ambulatory Visit: Payer: Federal, State, Local not specified - PPO

## 2021-12-16 ENCOUNTER — Encounter (HOSPITAL_BASED_OUTPATIENT_CLINIC_OR_DEPARTMENT_OTHER): Payer: Self-pay | Admitting: Emergency Medicine

## 2021-12-16 ENCOUNTER — Emergency Department (HOSPITAL_BASED_OUTPATIENT_CLINIC_OR_DEPARTMENT_OTHER)
Admission: EM | Admit: 2021-12-16 | Discharge: 2021-12-16 | Disposition: A | Discharge status: Home / Self Care | Payer: Medicare Other | Attending: Emergency Medicine | Admitting: Emergency Medicine

## 2021-12-16 ENCOUNTER — Other Ambulatory Visit: Payer: Self-pay

## 2021-12-16 DIAGNOSIS — Z87891 Personal history of nicotine dependence: Secondary | ICD-10-CM | POA: Insufficient documentation

## 2021-12-16 DIAGNOSIS — I83892 Varicose veins of left lower extremities with other complications: Secondary | ICD-10-CM | POA: Diagnosis not present

## 2021-12-16 DIAGNOSIS — I83899 Varicose veins of unspecified lower extremities with other complications: Secondary | ICD-10-CM

## 2021-12-16 DIAGNOSIS — Z7901 Long term (current) use of anticoagulants: Secondary | ICD-10-CM | POA: Insufficient documentation

## 2021-12-16 DIAGNOSIS — R2242 Localized swelling, mass and lump, left lower limb: Secondary | ICD-10-CM | POA: Diagnosis present

## 2021-12-16 NOTE — ED Provider Notes (Signed)
[facility] Provider Note: Lowella Dell, MD, FACEP  CSN: [id number] MRN: [id number] ARRIVAL: 12/16/21 at 2314 ROOM: MH01/[facility]01   CHIEF COMPLAINT  Leg Swelling   HISTORY OF PRESENT ILLNESS  12/16/21 11:48 PM Katherine Porter is a 87 y.o. female with a longstanding history of Xarelto use for atrial fibrillation.  She is here after noticing a tender, ecchymotic area of her medial left popliteal fossa earlier today.  She is not aware of any trauma to the area and there is no associated pain or tenderness.  She has a history of some kind of vascular procedure on her left leg but it is unclear if this was for venous thrombosis or arterial occlusion but she does have a history of venous stasis of the legs.  She denies chest pain or shortness of breath.   Past Medical History:  Diagnosis Date   Atrial fibrillation (HCC)    DVT (deep venous thrombosis) (HCC)     Past Surgical History:  Procedure Laterality Date   APPENDECTOMY     BREAST SURGERY     spleenectomy      No family history on file.  Social History   Tobacco Use   Smoking status: Former   Smokeless tobacco: Never  Substance Use Topics   Alcohol use: Yes    Alcohol/week: 2.0 standard drinks    Types: 2 Glasses of wine per week    Comment: 5 days a week   Drug use: No    Prior to Admission medications   Medication Sig Start Date End Date Taking? Authorizing Provider  calcium carbonate (TUMS) 500 MG chewable tablet Chew 500 mg by mouth daily.    [provider]  cycloSPORINE (RESTASIS) 0.05 % ophthalmic emulsion Place 1 drop into both eyes 2 (two) times daily.    [provider]  diltiazem (CARDIZEM CD) 120 MG 24 hr capsule Take 120 mg by mouth daily. 05/12/17   [provider]  gabapentin (NEURONTIN) 600 MG tablet Take 600 mg by mouth daily. 12/18/17   [provider]  Multiple Vitamins-Minerals (MULTIVITAMIN ADULT PO) Take 1 tablet by mouth daily.    [provider]  ondansetron (ZOFRAN) 4 MG tablet Take 1 tablet (4 mg total) by mouth every 6 (six) hours. 12/22/17   Terrilee Files, MD  XARELTO STARTER PACK 15 & 20 MG TBPK Take 15-20 mg by mouth as directed. Take as directed on package: Start with one 15mg  tablet by mouth twice a day with food. On Day 22, switch to one 20mg  tablet once a day with food. Patient taking differently: Take 20 mg by mouth daily. Take as directed on packag 10/14/15   , MD    Allergies Aspirin, Naproxen, Other, Tramadol, and Codeine   REVIEW OF SYSTEMS  Negative except as noted here or in the History of Present Illness.   PHYSICAL EXAMINATION  Initial Vital Signs Blood pressure (!) 144/77, pulse 80, temperature 97.6 F (36.4 C), temperature source Oral, resp. rate 18, height 5\' 5"  (1.651 m), weight 65.8 kg, SpO2 97 %.  Examination General: Well-developed, well-nourished female in no acute distress; appearance consistent with age of record HENT: normocephalic; atraumatic Eyes: pupils equal, round and reactive to light; extraocular muscles intact; bilateral pseudophakia Neck: supple Heart: regular rate and rhythm Lungs: clear to auscultation bilaterally Abdomen: soft; nondistended; nontender; bowel sounds present Extremities: No deformity; full range of motion; small, mildly ecchymotic and swollen area of medial left popliteal fossa Neurologic: Awake, alert  and oriented; motor function intact in all extremities and symmetric; no facial droop Skin: Warm and dry Psychiatric: Normal mood and affect   RESULTS  Summary of this visit's results, reviewed and interpreted by myself:   EKG Interpretation  Date/Time:    Ventricular Rate:    PR Interval:    QRS Duration:   QT Interval:    QTC Calculation:   R Axis:     Text Interpretation:         Laboratory Studies: No results found for this or any previous visit (from the past 24 hour(s)). Imaging Studies: No results found.  ED COURSE and  MDM  Nursing notes, initial and subsequent vitals signs, including pulse oximetry, reviewed and interpreted by myself.  Vitals:   12/16/21 2323 12/16/21 2324  BP: (!) 144/77   Pulse: 80   Resp: 18   Temp: 97.6 F (36.4 C)   TempSrc: Oral   SpO2: 97%   Weight:  65.8 kg  Height:  5\' 5"  (1.651 m)   Medications - No data to display  The lesion on the patient's leg has the appearance of a small ruptured varicose vein.  She may be at increased risk of bleeding due to Xarelto use.  For peace of mind however she would like to have a Doppler ultrasound performed tomorrow given her history of venous? occlusion.  PROCEDURES  Procedures   ED DIAGNOSES     ICD-10-CM   1. Ruptured varicose vein  I83.899          Dava Rensch, , MD 12/16/21 2352

## 2021-12-16 NOTE — ED Triage Notes (Signed)
Small swollen area noted to back of the left leg.  Reports no pain noted just noticed the area tonight.

## 2021-12-17 ENCOUNTER — Ambulatory Visit (HOSPITAL_BASED_OUTPATIENT_CLINIC_OR_DEPARTMENT_OTHER)
Admission: RE | Admit: 2021-12-17 | Discharge: 2021-12-17 | Disposition: A | Discharge status: Home / Self Care | Payer: Medicare Other | Source: Ambulatory Visit | Attending: Emergency Medicine | Admitting: Emergency Medicine

## 2021-12-17 ENCOUNTER — Other Ambulatory Visit (HOSPITAL_BASED_OUTPATIENT_CLINIC_OR_DEPARTMENT_OTHER): Payer: Self-pay | Admitting: Emergency Medicine

## 2021-12-17 DIAGNOSIS — R2242 Localized swelling, mass and lump, left lower limb: Secondary | ICD-10-CM

## 2023-11-27 DIAGNOSIS — R269 Unspecified abnormalities of gait and mobility: Secondary | ICD-10-CM

## 2023-12-11 NOTE — Telephone Encounter (Signed)
 I called the patient's daughter (verified 2 IDs) and discussed the pathology results.  Patient has upcoming neurology appointment and all risk factors look optimized on my review.  Electronically signed by: Jaclyn Janine White, MD 12/11/2023 3:32 PM

## 2024-05-24 DIAGNOSIS — M545 Low back pain, unspecified: Secondary | ICD-10-CM | POA: Diagnosis present

## 2024-05-27 ENCOUNTER — Emergency Department (HOSPITAL_COMMUNITY)

## 2024-05-27 ENCOUNTER — Observation Stay (HOSPITAL_COMMUNITY)
Admission: EM | Admit: 2024-05-27 | Discharge: 2024-05-31 | Disposition: A | Discharge status: Home Health | Attending: Internal Medicine | Admitting: Internal Medicine

## 2024-05-27 ENCOUNTER — Encounter (HOSPITAL_COMMUNITY): Payer: Self-pay | Admitting: Internal Medicine

## 2024-05-27 DIAGNOSIS — R2689 Other abnormalities of gait and mobility: Secondary | ICD-10-CM | POA: Insufficient documentation

## 2024-05-27 DIAGNOSIS — S32019A Unspecified fracture of first lumbar vertebra, initial encounter for closed fracture: Secondary | ICD-10-CM | POA: Insufficient documentation

## 2024-05-27 DIAGNOSIS — M545 Low back pain, unspecified: Secondary | ICD-10-CM | POA: Diagnosis present

## 2024-05-27 DIAGNOSIS — Z7901 Long term (current) use of anticoagulants: Secondary | ICD-10-CM

## 2024-05-27 DIAGNOSIS — R471 Dysarthria and anarthria: Secondary | ICD-10-CM | POA: Diagnosis present

## 2024-05-27 DIAGNOSIS — Z79899 Other long term (current) drug therapy: Secondary | ICD-10-CM | POA: Insufficient documentation

## 2024-05-27 DIAGNOSIS — I10 Essential (primary) hypertension: Secondary | ICD-10-CM | POA: Diagnosis present

## 2024-05-27 DIAGNOSIS — S065XAA Traumatic subdural hemorrhage with loss of consciousness status unknown, initial encounter: Secondary | ICD-10-CM | POA: Diagnosis present

## 2024-05-27 DIAGNOSIS — I480 Paroxysmal atrial fibrillation: Secondary | ICD-10-CM | POA: Diagnosis present

## 2024-05-27 DIAGNOSIS — E785 Hyperlipidemia, unspecified: Secondary | ICD-10-CM | POA: Diagnosis present

## 2024-05-27 DIAGNOSIS — F10920 Alcohol use, unspecified with intoxication, uncomplicated: Secondary | ICD-10-CM | POA: Insufficient documentation

## 2024-05-27 DIAGNOSIS — G459 Transient cerebral ischemic attack, unspecified: Principal | ICD-10-CM | POA: Diagnosis present

## 2024-05-27 DIAGNOSIS — Z86718 Personal history of other venous thrombosis and embolism: Secondary | ICD-10-CM

## 2024-05-27 DIAGNOSIS — K219 Gastro-esophageal reflux disease without esophagitis: Secondary | ICD-10-CM | POA: Diagnosis not present

## 2024-05-27 DIAGNOSIS — J449 Chronic obstructive pulmonary disease, unspecified: Secondary | ICD-10-CM | POA: Diagnosis not present

## 2024-05-27 DIAGNOSIS — D688 Other specified coagulation defects: Secondary | ICD-10-CM

## 2024-05-27 DIAGNOSIS — R269 Unspecified abnormalities of gait and mobility: Secondary | ICD-10-CM

## 2024-05-27 DIAGNOSIS — M5416 Radiculopathy, lumbar region: Secondary | ICD-10-CM | POA: Diagnosis not present

## 2024-05-27 DIAGNOSIS — X58XXXA Exposure to other specified factors, initial encounter: Secondary | ICD-10-CM | POA: Diagnosis not present

## 2024-05-27 DIAGNOSIS — J4489 Other specified chronic obstructive pulmonary disease: Secondary | ICD-10-CM | POA: Diagnosis present

## 2024-05-27 LAB — I-STAT CHEM 8, ED
BUN: 14 mg/dL (ref 8–23)
Calcium, Ion: 1.12 mmol/L — ABNORMAL LOW (ref 1.15–1.40)
Chloride: 105 mmol/L (ref 98–111)
Creatinine, Ser: 0.7 mg/dL (ref 0.44–1.00)
Glucose, Bld: 116 mg/dL — ABNORMAL HIGH (ref 70–99)
HCT: 42 % (ref 36.0–46.0)
Hemoglobin: 14.3 g/dL (ref 12.0–15.0)
Potassium: 3.9 mmol/L (ref 3.5–5.1)
Sodium: 135 mmol/L (ref 135–145)
TCO2: 19 mmol/L — ABNORMAL LOW (ref 22–32)

## 2024-05-27 LAB — CBC
HCT: 42.8 % (ref 36.0–46.0)
Hemoglobin: 13.6 g/dL (ref 12.0–15.0)
MCH: 30.5 pg (ref 26.0–34.0)
MCHC: 31.8 g/dL (ref 30.0–36.0)
MCV: 96 fL (ref 80.0–100.0)
Platelets: 264 K/uL (ref 150–400)
RBC: 4.46 MIL/uL (ref 3.87–5.11)
RDW: 15.1 % (ref 11.5–15.5)
WBC: 11.8 K/uL — ABNORMAL HIGH (ref 4.0–10.5)
nRBC: 0 % (ref 0.0–0.2)

## 2024-05-27 LAB — DIFFERENTIAL
Abs Immature Granulocytes: 0.05 K/uL (ref 0.00–0.07)
Basophils Absolute: 0 K/uL (ref 0.0–0.1)
Basophils Relative: 0 %
Eosinophils Absolute: 0.1 K/uL (ref 0.0–0.5)
Eosinophils Relative: 1 %
Immature Granulocytes: 0 %
Lymphocytes Relative: 26 %
Lymphs Abs: 3.1 K/uL (ref 0.7–4.0)
Monocytes Absolute: 1.2 K/uL — ABNORMAL HIGH (ref 0.1–1.0)
Monocytes Relative: 11 %
Neutro Abs: 7.3 K/uL (ref 1.7–7.7)
Neutrophils Relative %: 62 %

## 2024-05-27 LAB — COMPREHENSIVE METABOLIC PANEL WITH GFR
ALT: 31 U/L (ref 0–44)
AST: 19 U/L (ref 15–41)
Albumin: 3.4 g/dL — ABNORMAL LOW (ref 3.5–5.0)
Alkaline Phosphatase: 201 U/L — ABNORMAL HIGH (ref 38–126)
Anion gap: 11 (ref 5–15)
BUN: 14 mg/dL (ref 8–23)
CO2: 19 mmol/L — ABNORMAL LOW (ref 22–32)
Calcium: 9.2 mg/dL (ref 8.9–10.3)
Chloride: 102 mmol/L (ref 98–111)
Creatinine, Ser: 0.78 mg/dL (ref 0.44–1.00)
GFR, Estimated: >60 mL/min (ref >60)
Glucose, Bld: 115 mg/dL — ABNORMAL HIGH (ref 70–99)
Potassium: 4 mmol/L (ref 3.5–5.1)
Sodium: 132 mmol/L — ABNORMAL LOW (ref 135–145)
Total Bilirubin: 0.7 mg/dL (ref 0.0–1.2)
Total Protein: 6.8 g/dL (ref 6.5–8.1)

## 2024-05-27 LAB — CBG MONITORING, ED: Glucose-Capillary: 113 mg/dL — ABNORMAL HIGH (ref 70–99)

## 2024-05-27 LAB — RAPID URINE DRUG SCREEN, HOSP PERFORMED
Amphetamines: NOT DETECTED
Barbiturates: NOT DETECTED
Benzodiazepines: NOT DETECTED
Cocaine: NOT DETECTED
Opiates: NOT DETECTED
Tetrahydrocannabinol: NOT DETECTED

## 2024-05-27 LAB — ETHANOL: Alcohol, Ethyl (B): <15 mg/dL (ref <15)

## 2024-05-27 LAB — PROTIME-INR
INR: 2.3 — ABNORMAL HIGH (ref 0.8–1.2)
Prothrombin Time: 26 s — ABNORMAL HIGH (ref 11.4–15.2)

## 2024-05-27 LAB — APTT: aPTT: 53 s — ABNORMAL HIGH (ref 24–36)

## 2024-05-27 MED ORDER — MORPHINE SULFATE (PF) 2 MG/ML IV SOLN
2.0000 mg | INTRAVENOUS | Status: DC | PRN
  Administered 2024-05-28 – 2024-05-29 (×2): 2 mg via INTRAVENOUS
  Filled 2024-05-27 (×4): qty 1

## 2024-05-27 MED ORDER — LORAZEPAM 2 MG/ML IJ SOLN
1.0000 mg | Freq: Once | INTRAMUSCULAR | Status: AC
  Administered 2024-05-27: 1 mg via INTRAVENOUS
  Filled 2024-05-27: qty 1

## 2024-05-27 MED ORDER — MELATONIN 5 MG PO TABS
5.0000 mg | ORAL_TABLET | Freq: Every day | ORAL | Status: DC
  Administered 2024-05-28 – 2024-05-30 (×3): 5 mg via ORAL
  Filled 2024-05-27 (×5): qty 1

## 2024-05-27 MED ORDER — LORAZEPAM 2 MG/ML IJ SOLN
1.0000 mg | INTRAMUSCULAR | Status: DC | PRN
  Administered 2024-05-27 – 2024-05-30 (×7): 1 mg via INTRAVENOUS
  Filled 2024-05-27 (×9): qty 1

## 2024-05-27 MED ORDER — IOHEXOL 350 MG/ML SOLN
75.0000 mL | Freq: Once | INTRAVENOUS | Status: AC | PRN
  Administered 2024-05-27: 75 mL via INTRAVENOUS

## 2024-05-27 NOTE — Plan of Care (Signed)
 MRI brain completed and reviewed-no evidence of acute infarction.  Similar left convexity subdural. Management of the subdural hematoma including follow-up on repeat imaging per neurosurgery. Pending EEG-please call neurology with questions if the EEG is abnormal. Inpatient neurology will be available as needed   -- Eligio Lav, MD Neurologist [facility]

## 2024-05-27 NOTE — ED Notes (Signed)
 Pt IV noted to be infiltrated. RN Benton made aware

## 2024-05-27 NOTE — ED Notes (Signed)
 Pt transported to MRI

## 2024-05-27 NOTE — ED Notes (Signed)
 Pt agitated and restless. Removing blood pressure cuff and cardiac monitoring lines. Md advised medication  to be administered.

## 2024-05-27 NOTE — H&P (Signed)
 History and Physical    Patient: Katherine Porter FMW:[id number] DOB: 01-Jan-1935 DOA: 05/27/2024 DOS: the patient was seen and examined on 05/27/2024 PCP: Deane Camie HERO., MD  Patient coming from: Home  Chief Complaint:  Chief Complaint  Patient presents with   Code Stroke   HPI: Katherine Porter is a 89 y.o. female with medical history significant of atrial fibrillation, history of DVT, previous CVA back in January at [facility] where MRI showed a small meningioma As well as possible CVA.  Patient also had questionable ovarian lesion with subsequent follow-up ultrasound with recommendation for outpatient follow-up but no mention of intracranial hematoma.  At that time patient was discharged to follow-up with neurology.  Patient also has chronic low back pain with recent x-rays done but no MRI seen.  She was brought in today secondary to sudden onset of dysarthria confusion and severe low back pain.  Patient was seen and evaluated.  His CT and subsequent MRI of the brain showed an 8 mm subdural hematoma but no acute CVA.  Patient has continued to have some agitation.  Neurology and neurosurgery was consulted by the ER with recommendation for admission, repeat CT head in the morning and patient will be seen by them in the morning.  Review of Systems: As mentioned in the history of present illness. All other systems reviewed and are negative. Past Medical History:  Diagnosis Date   Atrial fibrillation (HCC)    DVT (deep venous thrombosis) (HCC)    Past Surgical History:  Procedure Laterality Date   APPENDECTOMY     BREAST SURGERY     spleenectomy     Social History:  reports that she has quit smoking. She has never used smokeless tobacco. She reports current alcohol use of about 2.0 standard drinks of alcohol per week. She reports that she does not use drugs.  Allergies  Allergen Reactions   Aspirin Other (See Comments)    Pt is unable to take due to contraindication with other meds    Naproxen Other (See Comments)    Contraindicated due to medication list   Other Other (See Comments)    bruising   Tramadol Other (See Comments) and Nausea And Vomiting   Codeine Nausea Only    No family history on file.  Prior to Admission medications   Medication Sig Start Date End Date Taking? Authorizing Provider  calcium carbonate (TUMS) 500 MG chewable tablet Chew 500 mg by mouth daily.    [provider]  cycloSPORINE (RESTASIS) 0.05 % ophthalmic emulsion Place 1 drop into both eyes 2 (two) times daily.    [provider]  diltiazem (CARDIZEM CD) 120 MG 24 hr capsule Take 120 mg by mouth daily. 05/12/17   [provider]  gabapentin (NEURONTIN) 600 MG tablet Take 600 mg by mouth daily. 12/18/17   [provider]  Multiple Vitamins-Minerals (MULTIVITAMIN ADULT PO) Take 1 tablet by mouth daily.    [provider]  ondansetron  (ZOFRAN ) 4 MG tablet Take 1 tablet (4 mg total) by mouth every 6 (six) hours. 12/22/17   Towana Ozell BROCKS, MD  XARELTO  STARTER PACK 15 & 20 MG TBPK Take 15-20 mg by mouth as directed. Take as directed on package: Start with one 15mg  tablet by mouth twice a day with food. On Day 22, switch to one 20mg  tablet once a day with food. Patient taking differently: Take 20 mg by mouth daily. Take as directed on packag 10/14/15   Freddi Hamilton, MD  Physical Exam: Vitals:   05/27/24 1921 05/27/24 1930 05/27/24 2015 05/27/24 2325  BP: (!) 141/80 123/61 138/89 138/89  Pulse: 89 (!) 120 80 80  Resp: 17 (!) 22 15 18   Temp: 98.1 F (36.7 C)   98 F (36.7 C)  TempSrc: Oral   Oral  SpO2: 100% 96% 97% 97%  Weight:    74 kg  Height:    5' 5 (1.651 m)   Constitutional: Acutely ill looking, slight agitation NAD, calm, comfortable Eyes: PERRL, lids and conjunctivae normal ENMT: Mucous membranes are moist. Posterior pharynx clear of any exudate or lesions.Normal dentition.  Neck: normal, supple, no masses, no  thyromegaly Respiratory: clear to auscultation bilaterally, no wheezing, no crackles. Normal respiratory effort. No accessory muscle use.  Cardiovascular: A-fib with RVR, no murmurs / rubs / gallops. No extremity edema. 2+ pedal pulses. No carotid bruits.  Abdomen: no tenderness, no masses palpated. No hepatosplenomegaly. Bowel sounds positive.  Musculoskeletal: Good range of motion, no joint swelling or tenderness, Skin: no rashes, lesions, ulcers. No induration Neurologic: CN 2-12 grossly intact. Sensation intact, DTR normal. Strength 5/5 in all 4.  Psychiatric: Normal judgment and insight. Alert and oriented x 3. Normal mood  Data Reviewed:  Heart rate is 120 blood pressure 140/83, white count 11.8 CO2 of 19 INR is 2.3 glucose 116.  Sodium 132 alkaline phos was at 201 acute drug screen is negative head CT without contrast done and MRI of the brain showing similar size of an 8 mm thick left cerebral convexity subdural hematoma with suspected subacute Jassen small volume of subarachnoid hemorrhage.  There was similar mass effect.  There was no evidence of acute infarct.  There is 1 cm meningioma along the right petrous bone without mass effect.  EKG showed A-fib with a rate of 120  Assessment and Plan:  #1 possible TIA: Patient be admitted.  MRI already done.  Cannot be on blood thinner due to intracranial hemorrhage.  Patient will be getting PT and OT consult.  Repeat head CT in the morning.  Suspected hemorrhagic stroke versus seizure.  Will get EEG as well.  Continue statin.  #2 intracranial hemorrhage: Subdural hematoma 8 mm with subarachnoid hemorrhage.  No anticoagulation.  Repeat head CT in the morning and if is stable probably will be discharged home.  #3 chronic low back pain: Patient is complaining of 10 out of 10 pain in her lumbar region.  She has chronic degenerative disc disease and has been followed by her physicians.  I do not see any MRI of the low back recently.  May need MRI of  the lumbar spine prior to discharge.  #4 atrial fibrillation with rapid response: Will continue with beta-blockers for rate control but no anticoagulation.  #5 history of DVT: Again no anticoagulation.  INR however is noted to be 2.1.  We will follow and repeat testing.  This may have contributed to her intracranial hemorrhage.  #6 essential hypertension: Blood pressure at this point appears controlled.  Continue to monitor.  #7 GERD: Continues PPIs  #8 asthma COPD: Stable.    Advance Care Planning:   Code Status: Full Code   Consults: Neurosurgery  Family Communication: Husband and son-in-law at bedside  Severity of Illness: The appropriate patient status for this patient is OBSERVATION. Observation status is judged to be reasonable and necessary in order to provide the required intensity of service to ensure the patient's safety. The patient's presenting symptoms, physical exam findings, and initial radiographic and laboratory  data in the context of their medical condition is felt to place them at decreased risk for further clinical deterioration. Furthermore, it is anticipated that the patient will be medically stable for discharge from the hospital within 2 midnights of admission.   AuthorBETHA SIM KNOLL, MD 05/27/2024 11:35 PM  For on call review [email or web address].

## 2024-05-27 NOTE — ED Notes (Signed)
 Assisted pt with bedpan

## 2024-05-27 NOTE — ED Triage Notes (Addendum)
 Pt to er for code stroke, pt met at bridge by ed md, rn times two, lab and neuro rn and neuro MD, pt presents for trouble finding words and slurred speech and facial droop, states that last known well is 1630 and symptoms were first noticed at 1700, pt awake and answering questions. Pt directly to ct

## 2024-05-27 NOTE — ED Notes (Signed)
 CBG: 113

## 2024-05-27 NOTE — ED Notes (Signed)
 Left bicep has swelling 2 inches above IV site. This nurse removed IV.

## 2024-05-27 NOTE — ED Provider Notes (Signed)
 Emergency Department Provider Note   I have reviewed the triage vital signs and the nursing notes.   HISTORY  Chief Complaint No chief complaint on file.   HPI Katherine Porter is a 89 y.o. female past medical history of atrial fibrillation on Xarelto  presents to the emergency department as a code stroke.  She was last normal at 4:30 PM when she was seen by her husband.  She came back in around 5 PM and had some dysarthria and reported difficulty with walking.  EMS were called to the scene and activated a code stroke from the field.  No unilateral weakness or numbness appreciated. Last dose of Xarelto  was this AM. Denies any recent falls.    Past Medical History:  Diagnosis Date   Atrial fibrillation (HCC)    DVT (deep venous thrombosis) (HCC)     Review of Systems  Constitutional: No fever/chills Cardiovascular: Denies chest pain. Respiratory: Denies shortness of breath. Gastrointestinal: No abdominal pain.  Skin: Negative for rash. Neurological: Negative for headaches, focal weakness.   ____________________________________________   PHYSICAL EXAM:  VITAL SIGNS: Vitals:   05/27/24 1900 05/27/24 1921  BP: (!) 143/83 (!) 141/80  Pulse:  89  Resp:  17  Temp:  98.1 F (36.7 C)  SpO2:  100%    Constitutional: Alert and oriented. Well appearing and in no acute distress. Eyes: Conjunctivae are normal. PERRL. EOMI. Head: Atraumatic. Nose: No congestion/rhinnorhea. Mouth/Throat: Mucous membranes are moist.   Neck: No stridor.  Cardiovascular: Normal rate, regular rhythm. Good peripheral circulation. Grossly normal heart sounds.   Respiratory: Normal respiratory effort.  No retractions. Lungs CTAB. Gastrointestinal: Soft and nontender. No distention.  Musculoskeletal: No lower extremity tenderness nor edema. No gross deformities of extremities. Neurologic:  Mild dysarthria. No gross focal neurologic deficits are appreciated.  Skin:  Skin is warm, dry and intact. No  rash noted.  ____________________________________________   LABS (all labs ordered are listed, but only abnormal results are displayed)  Labs Reviewed  CBG MONITORING, ED - Abnormal; Notable for the following components:      Result Value   Glucose-Capillary 113 (*)    All other components within normal limits   ____________________________________________  EKG   EKG Interpretation Date/Time:  Thursday May 27 2024 20:16:05 EDT Ventricular Rate:  92 PR Interval:    QRS Duration:  91 QT Interval:  389 QTC Calculation: 458 R Axis:   62  Text Interpretation: Atrial fibrillation Low voltage, precordial leads Confirmed by Darra Chew [phone]) on 05/27/2024 8:25:22 PM        ____________________________________________  RADIOLOGY  No results found.  ____________________________________________   PROCEDURES  Procedure(s) performed:   Procedures  CRITICAL CARE Performed by: Chew KANDICE Darra Total critical care time: 35 minutes Critical care time was exclusive of separately billable procedures and treating other patients. Critical care was necessary to treat or prevent imminent or life-threatening deterioration. Critical care was time spent personally by me on the following activities: development of treatment plan with patient and/or surrogate as well as nursing, discussions with consultants, evaluation of patient's response to treatment, examination of patient, obtaining history from patient or surrogate, ordering and performing treatments and interventions, ordering and review of laboratory studies, ordering and review of radiographic studies, pulse oximetry and re-evaluation of patient's condition.  Chew Darra, MD Emergency Medicine  ____________________________________________   INITIAL IMPRESSION / ASSESSMENT AND PLAN / ED COURSE  Pertinent labs & imaging results that were available during my care of the patient were reviewed  by me and considered in my medical  decision making (see chart for details).   This patient is Presenting for Evaluation of AMS, which does require a range of treatment options, and is a complaint that involves a high risk of morbidity and mortality.  The Differential Diagnoses includes but is not exclusive to alcohol, illicit or prescription medications, intracranial pathology such as stroke, intracerebral hemorrhage, fever or infectious causes including sepsis, hypoxemia, uremia, trauma, endocrine related disorders such as diabetes, hypoglycemia, thyroid-related diseases, etc.   Critical Interventions-    Medications - No data to display  Reassessment after intervention:     I did obtain Additional Historical Information from EMS.  I decided to review pertinent External Data, and in summary paper records at bedside show Xarelto  on med list with admin this AM.   Clinical Laboratory Tests Ordered, included ***  Radiologic Tests Ordered, included CT head. I independently interpreted the images and agree with radiology interpretation.   Cardiac Monitor Tracing which shows ***   Social Determinants of Health Risk patient is a non-smoker.   Consult complete with Neurology, Dr. Voncile.  No TNK given her anticoagulated status.  Will defer decision for reversal of Xarelto  to neurosurgery with mixed density subdural hematoma on CT.  Recommends observation and MRI.   NSG, Dr. Debby. Hold Xarelto . No reversal. CT in the AM. Plan for follow up with them in 2 weeks.   Medical Decision Making: Summary:  Patient arrives as a code stroke.  She has mild dysarthria on arrival.  She is anticoagulated on Xarelto  for A-fib.  Cleared at the bridge for emergent CT head/CTA head and neck.  Evaluated alongside neurology.   Reevaluation with update and discussion with   ***Considered admission***  Patient's presentation is most consistent with acute presentation with potential threat to life or bodily function.   Disposition:    ____________________________________________  FINAL CLINICAL IMPRESSION(S) / ED DIAGNOSES  Final diagnoses:  None     NEW OUTPATIENT MEDICATIONS STARTED DURING THIS VISIT:  New Prescriptions   No medications on file    Note:  This document was prepared using Dragon voice recognition software and may include unintentional dictation errors.  Fonda Law, MD, [facility] Emergency Medicine

## 2024-05-27 NOTE — Consult Note (Signed)
 NEUROLOGY CONSULT NOTE   Date of service: May 27, 2024 Patient Name: Katherine Porter MRN:  [id number] DOB:  [birth year] Chief Complaint: Right-sided weakness, slurred speech Requesting Provider: Darra Fonda MATSU, MD  History of Present Illness  Katherine Porter is a 89 y.o. female with hx of atrial fibrillation on anticoagulation, presented from facility for evaluation of strokelike symptoms of word finding difficulty, right facial droop and difficulty walking.  Last known well was 4:30 PM and symptoms were first noticed around 5 PM.  She was able to answer all questions but felt her speech was slurred.  She also had some right-sided weakness which improved on the way. She continues to have slurred speech at this time. Denies any headaches.  Denies any falls.  Denies any fevers chills prior to presentation  LKW: 4:30 PM Modified rankin score: 2-Slight disability-UNABLE to perform all activities but does not need assistance IV Thrombolysis: On anticoagulation, SDH EVT: No ELVO NIHSS components Score: Comment  1a Level of Conscious 0[x]  1[]  2[]  3[]      1b LOC Questions 0[]  1[x]  2[]       1c LOC Commands 0[x]  1[]  2[]       2 Best Gaze 0[x]  1[]  2[]       3 Visual 0[x]  1[]  2[]  3[]      4 Facial Palsy 0[x]  1[]  2[]  3[]      5a Motor Arm - left 0[x]  1[]  2[]  3[]  4[]  UN[]    5b Motor Arm - Right 0[x]  1[]  2[]  3[]  4[]  UN[]    6a Motor Leg - Left 0[x]  1[]  2[]  3[]  4[]  UN[]    6b Motor Leg - Right 0[x]  1[]  2[]  3[]  4[]  UN[]    7 Limb Ataxia 0[x]  1[]  2[]  UN[]      8 Sensory 0[x]  1[]  2[]  UN[]      9 Best Language 0[x]  1[]  2[]  3[]      10 Dysarthria 0[]  1[x]  2[]  UN[]      11 Extinct. and Inattention 0[x]  1[]  2[]       TOTAL: 2      ROS  Comprehensive ROS performed and pertinent positives documented in HPI   Past History   Past Medical History:  Diagnosis Date   Atrial fibrillation (HCC)    DVT (deep venous thrombosis) (HCC)     Past Surgical History:  Procedure Laterality Date   APPENDECTOMY     BREAST  SURGERY     spleenectomy      Family History: No family history on file.  Social History  reports that she has quit smoking. She has never used smokeless tobacco. She reports current alcohol use of about 2.0 standard drinks of alcohol per week. She reports that she does not use drugs.  Allergies  Allergen Reactions   Aspirin Other (See Comments)    Pt is unable to take due to contraindication with other meds   Naproxen Other (See Comments)    Contraindicated due to medication list   Other Other (See Comments)    bruising   Tramadol Other (See Comments) and Nausea And Vomiting   Codeine Nausea Only    Medications  No current facility-administered medications for this encounter.  Current Outpatient Medications:    calcium carbonate (TUMS) 500 MG chewable tablet, Chew 500 mg by mouth daily., Disp: , Rfl:    cycloSPORINE (RESTASIS) 0.05 % ophthalmic emulsion, Place 1 drop into both eyes 2 (two) times daily., Disp: , Rfl:    diltiazem (CARDIZEM CD) 120 MG 24 hr capsule, Take 120 mg by mouth daily., Disp: , Rfl:  gabapentin (NEURONTIN) 600 MG tablet, Take 600 mg by mouth daily., Disp: , Rfl:    Multiple Vitamins-Minerals (MULTIVITAMIN ADULT PO), Take 1 tablet by mouth daily., Disp: , Rfl:    ondansetron  (ZOFRAN ) 4 MG tablet, Take 1 tablet (4 mg total) by mouth every 6 (six) hours., Disp: 12 tablet, Rfl: 0   XARELTO  STARTER PACK 15 & 20 MG TBPK, Take 15-20 mg by mouth as directed. Take as directed on package: Start with one 15mg  tablet by mouth twice a day with food. On Day 22, switch to one 20mg  tablet once a day with food. (Patient taking differently: Take 20 mg by mouth daily. Take as directed on packag), Disp: 51 each, Rfl: 0  Vitals   Vitals:   June 01, 2024 1857 06/01/24 1900  BP:  (!) 143/83  Weight: 73.8 kg     Body mass index is 27.07 kg/m.   Physical Exam  General: Awake alert in no distress HEENT: Normocephalic atraumatic Lungs: Clear Cardiovascular: Irregularly  irregular Neurological exam Awake alert oriented x 3.  Got the month correct but her age wrong. Speech is dysarthric No evidence of aphasia Cranial nerves: Pupils equal round react to light, extraocular movements intact, visual fields full, face appears grossly symmetric to me at rest with symmetric nasolabial folds and symmetric face on smiling, visual sensation intact, tongue and palate midline. Motor examination with no drift in any of the 4 extremities although right leg feels slightly weak than the left on individual muscle testing. Sensation intact to light touch without extinction Coordination examination reveals no dysmetria  Labs/Imaging/Neurodiagnostic studies   CBC:  Recent Labs  Lab Jun 01, 2024 1856 06/01/24 1857  WBC 11.8*  --   NEUTROABS 7.3  --   HGB 13.6 14.3  HCT 42.8 42.0  MCV 96.0  --   PLT 264  --    Basic Metabolic Panel:  Lab Results  Component Value Date   NA 135 06-01-24   K 3.9 06-01-2024   CO2 24 12/22/2017   GLUCOSE 116 (H) 01-Jun-2024   BUN 14 06/01/2024   CREATININE 0.70 2024/06/01   CALCIUM 9.2 12/22/2017   GFRNONAA >60 12/22/2017   GFRAA >60 12/22/2017   INR  Lab Results  Component Value Date   INR 2.3 (H) 01-Jun-2024   APTT  Lab Results  Component Value Date   APTT 53 (H) Jun 01, 2024   CT Head without contrast(Personally reviewed): No evidence of stroke.  Several isodense subdural collection over the left cerebral convexity with some hyperintense signal indicating mixed density blood with an acute component  CT angio Head and Neck with contrast(Personally reviewed): No ELVO  ASSESSMENT   Katherine Porter is a 89 y.o. female past history of paroxysmal atrial fibrillation on Xarelto  presenting for evaluation of sudden onset of slurred speech with last known well at 4:30 PM.  Noncontrast head CT does not show any evidence of large evolving infarct but does show a 7 mm thick mixed density left cerebral convexity subdural hematoma with areas  of mild hyperdensity suggesting a component of recent hemorrhage.  No substantial midline shift. Given her history of atrial fibrillation, a superimposed ischemic infarct always remains in the realm of possibility and an MRI would be helpful to rule that out.  Impression:  Slurred speech and right-sided transient facial droop and right-sided weakness along with gait ataxia likely secondary to left convexity subdural hematoma Coagulopathy secondary to anticoagulation at home  RECOMMENDATIONS  At this point, I would recommend management of the subdural hematoma  and decision on reversal for the anticoagulation per ER and neurosurgery. I would recommend blood pressure management to avoid hypotension.  Keep blood pressure systolic below 160. Obtain an MRI at some point and if that showed an ischemic stroke, please recall neurology service back. This was discussed with Dr. Darra, ED provider.   Addendum Care plan discussed with the neurosurgery APP [facility] via secure chat.  Neurosurgery of the opinion that this is a more subacute subdural hematoma and recommend holding Xarelto . I would recommend admitted to the hospitalist, repeat a head CT in the morning.  Also obtain an MRI of the brain to make sure there is no embolic infarct.  If all of that remains stable, she can be discharged home with outpatient neurosurgical follow-up in 2 weeks and repeat imaging. If the MRI shows a stroke, then we will revisit.  Plan discussed with Dr. Darra    ______________________________________________________________________    Signed, Eligio Lav, MD [facility]  CRITICAL CARE ATTESTATION Performed by: Eligio Lav, MD Total critical care time: 41 minutes Critical care time was exclusive of separately billable procedures and treating other patients and/or supervising APPs/Residents/Students Critical care was necessary to treat or prevent imminent or life-threatening deterioration. This  patient is critically ill and at significant risk for neurological worsening and/or death and care requires constant monitoring. Critical care was time spent personally by me on the following activities: development of treatment plan with patient and/or surrogate as well as nursing, discussions with consultants, evaluation of patient's response to treatment, examination of patient, obtaining history from patient or surrogate, ordering and performing treatments and interventions, ordering and review of laboratory studies, ordering and review of radiographic studies, pulse oximetry, re-evaluation of patient's condition, participation in multidisciplinary rounds and medical decision making of high complexity in the care of this patient.

## 2024-05-28 ENCOUNTER — Observation Stay (HOSPITAL_COMMUNITY)

## 2024-05-28 ENCOUNTER — Other Ambulatory Visit: Payer: Self-pay

## 2024-05-28 DIAGNOSIS — R569 Unspecified convulsions: Secondary | ICD-10-CM

## 2024-05-28 DIAGNOSIS — G459 Transient cerebral ischemic attack, unspecified: Secondary | ICD-10-CM | POA: Diagnosis not present

## 2024-05-28 LAB — LIPID PANEL
Cholesterol: 140 mg/dL (ref 0–200)
HDL: 60 mg/dL (ref >40)
LDL Cholesterol: 69 mg/dL (ref 0–99)
Total CHOL/HDL Ratio: 2.3 ratio
Triglycerides: 55 mg/dL (ref <150)
VLDL: 11 mg/dL (ref 0–40)

## 2024-05-28 LAB — HEMOGLOBIN A1C
Hgb A1c MFr Bld: 5.7 % — ABNORMAL HIGH (ref 4.8–5.6)
Mean Plasma Glucose: 116.89 mg/dL

## 2024-05-28 MED ORDER — CYCLOSPORINE 0.05 % OP EMUL
1.0000 [drp] | Freq: Two times a day (BID) | OPHTHALMIC | Status: DC
  Administered 2024-05-28 – 2024-05-31 (×7): 1 [drp] via OPHTHALMIC
  Filled 2024-05-28 (×9): qty 30

## 2024-05-28 MED ORDER — STROKE: EARLY STAGES OF RECOVERY BOOK
Freq: Once | Status: AC
  Filled 2024-05-28: qty 1

## 2024-05-28 MED ORDER — ACETAMINOPHEN 160 MG/5ML PO SOLN: 650.0000 mg | ORAL | Status: DC | PRN

## 2024-05-28 MED ORDER — HALOPERIDOL LACTATE 5 MG/ML IJ SOLN
3.0000 mg | Freq: Once | INTRAMUSCULAR | Status: AC
  Administered 2024-05-28: 3 mg via INTRAVENOUS
  Filled 2024-05-28: qty 1

## 2024-05-28 MED ORDER — SODIUM CHLORIDE 0.9 % IV SOLN: INTRAVENOUS | Status: AC

## 2024-05-28 MED ORDER — DILTIAZEM HCL ER COATED BEADS 120 MG PO CP24
120.0000 mg | ORAL_CAPSULE | Freq: Every day | ORAL | Status: DC
  Administered 2024-05-28 – 2024-05-31 (×4): 120 mg via ORAL
  Filled 2024-05-28 (×4): qty 1

## 2024-05-28 MED ORDER — SENNOSIDES-DOCUSATE SODIUM 8.6-50 MG PO TABS: 1.0000 | ORAL_TABLET | Freq: Every evening | ORAL | Status: DC | PRN

## 2024-05-28 MED ORDER — ACETAMINOPHEN 650 MG RE SUPP: 650.0000 mg | RECTAL | Status: DC | PRN

## 2024-05-28 MED ORDER — ONDANSETRON HCL 4 MG PO TABS: 4.0000 mg | ORAL_TABLET | Freq: Four times a day (QID) | ORAL | Status: DC | PRN

## 2024-05-28 MED ORDER — ACETAMINOPHEN 325 MG PO TABS
650.0000 mg | ORAL_TABLET | ORAL | Status: DC | PRN
  Administered 2024-05-31 (×2): 650 mg via ORAL
  Filled 2024-05-28 (×3): qty 2

## 2024-05-28 MED ORDER — GABAPENTIN 300 MG PO CAPS
600.0000 mg | ORAL_CAPSULE | Freq: Every day | ORAL | Status: DC
  Administered 2024-05-28 – 2024-05-31 (×4): 600 mg via ORAL
  Filled 2024-05-28 (×4): qty 2

## 2024-05-28 MED ORDER — CALCIUM CARBONATE ANTACID 500 MG PO CHEW
500.0000 mg | CHEWABLE_TABLET | Freq: Every day | ORAL | Status: DC
  Administered 2024-05-29 – 2024-05-30 (×2): 200 mg via ORAL
  Filled 2024-05-28 (×2): qty 1

## 2024-05-28 MED ORDER — GADOBUTROL 1 MMOL/ML IV SOLN
8.0000 mL | Freq: Once | INTRAVENOUS | Status: AC | PRN
  Administered 2024-05-28: 8 mL via INTRAVENOUS

## 2024-05-28 NOTE — Progress Notes (Signed)
 OT Cancellation Note  Patient Details Name: Katherine Porter MRN: [id number] DOB: 05-Sep-1935   Cancelled Treatment:    Reason Eval/Treat Not Completed: Other (comment). MRI showing acute L1 fx. Will hold on OT at this time until confirmation of need for back brace or not for mobilization. Will continue to follow.  [name] L. [name], OTR/L  05/28/24, 8:45 AM

## 2024-05-28 NOTE — Progress Notes (Signed)
 PT Cancellation Note  Patient Details Name: Katherine Porter MRN: [id number] DOB: 1935/02/19   Cancelled Treatment:    Reason Eval/Treat Not Completed: (P) Other (comment). Neurosurgery recommending TLSO when OOB, but pt's brace is back at her facility. Coordinated with family to retrieve brace and bring it to pt's room here when possible in order to conduct OOB assessment. Will plan to follow-up tomorrow per plan with family.   [name], PT, DPT Acute Rehabilitation Services  Office: [phone]    [name] 05/28/2024, 4:46 PM

## 2024-05-28 NOTE — Plan of Care (Signed)
  Problem: Clinical Measurements: Goal: Ability to maintain clinical measurements within normal limits will improve Outcome: Progressing Goal: Will remain free from infection Outcome: Progressing Goal: Diagnostic test results will improve Outcome: Progressing Goal: Respiratory complications will improve Outcome: Progressing Goal: Cardiovascular complication will be avoided Outcome: Progressing   Problem: Nutrition: Goal: Adequate nutrition will be maintained Outcome: Progressing   Problem: Elimination: Goal: Will not experience complications related to urinary retention Outcome: Progressing   Problem: Pain Managment: Goal: General experience of comfort will improve and/or be controlled Outcome: Progressing   Problem: Safety: Goal: Ability to remain free from injury will improve Outcome: Progressing   Problem: Education: Goal: Knowledge of disease or condition will improve Outcome: Progressing Goal: Knowledge of secondary prevention will improve (MUST DOCUMENT ALL) Outcome: Progressing Goal: Knowledge of patient specific risk factors will improve (DELETE if not current risk factor) Outcome: Progressing   Problem: Ischemic Stroke/TIA Tissue Perfusion: Goal: Complications of ischemic stroke/TIA will be minimized Outcome: Progressing   Problem: Health Behavior/Discharge Planning: Goal: Ability to manage health-related needs will improve Outcome: Progressing Goal: Goals will be collaboratively established with patient/family Outcome: Progressing   Problem: Nutrition: Goal: Risk of aspiration will decrease Outcome: Progressing   Problem: Cardiac: Goal: Ability to achieve and maintain adequate cardiopulmonary perfusion will improve Outcome: Progressing

## 2024-05-28 NOTE — Procedures (Signed)
 Patient Name: Katherine Porter  MRN: [id number]  Epilepsy Attending: Arlin MALVA Krebs  Referring Physician/Provider: Voncile Isles, MD  Date: 05/28/2024  Duration: 23.13 mins  Patient history: 89 y.o. female past history of paroxysmal atrial fibrillation on Xarelto  presenting for evaluation of sudden onset of slurred speech. EEG to evaluate for seizure.  Level of alertness: Awake, drowsy  AEDs during EEG study: Ativan   Technical aspects: This EEG study was done with scalp electrodes positioned according to the 10-20 International system of electrode placement. Electrical activity was reviewed with band pass filter of 1-70Hz , sensitivity of 7 uV/mm, display speed of 26mm/sec with a 60Hz  notched filter applied as appropriate. EEG data were recorded continuously and digitally stored.  Video monitoring was available and reviewed as appropriate.  Description: The posterior dominant rhythm consists of 9 Hz activity of moderate voltage (25-35 uV) seen predominantly in posterior head regions, symmetric and reactive to eye opening and eye closing. Drowsiness was characterized by attenuation of the posterior background rhythm. EEG showed intermittent generalized 3 to 6 Hz theta-delta slowing. Hyperventilation and photic stimulation were not performed.     ABNORMALITY - Intermittent slow, generalized  IMPRESSION: This study is suggestive of mild diffuse encephalopathy. No seizures or epileptiform discharges were seen throughout the recording.  Melodee Lupe O Tekila Caillouet

## 2024-05-28 NOTE — Progress Notes (Signed)
 PROGRESS NOTE    Katherine Porter  FMW:[id number] DOB: [birth year] DOA: 05/27/2024 PCP: Deane Camie HERO., MD  Outpatient Specialists:     Brief Narrative:  Patient is an 89 year old female with past medical history significant for atrial fibrillation on Xarelto  20 Mg p.o. once daily, history of DVT, meningioma and previous CVA.  Patient was seen alongside patient's husband and care.  Most of the history came from the patient's husband.  Apparently, patient fell about 2 weeks ago.  Patient was said to have seen Dr. Rosine Lucas Roll, neurosurgery team based out of [facility].  Patient was said to have developed dysarthria, confusion and back pain.  Patient presented as code stroke, however, MRI of the brain was negative for acute CVA.  Neurology team has signed off.  MRI brain and CT brain revealed subarachnoid hemorrhage left cerebral convexity that has increased slightly from 7 mm to 9 mm.  MRI of the lumbar spine revealed acute L1 fracture.  Above findings have been discussed with the neurosurgery team on-call, Dr. Gillie.  Neurosurgery team will see patient and advise further care.  Xarelto  is on hold.   Assessment & Plan:   Principal Problem:   TIA (transient ischemic attack) Active Problems:   Abnormality of gait and mobility   Acute midline low back pain without sciatica   Asthma with COPD (chronic obstructive pulmonary disease) (HCC)   Lumbar radiculopathy   Esophageal reflux   Essential hypertension   History of DVT of lower extremity   Hyperlipidemia   Paroxysmal A-fib (HCC)   Subdural hematoma (HCC)   Subdural hematoma: - CT head done earlier today revealed slightly increased size (from 7 mm to 9 mm). - Patient was on Xarelto  prior to presentation. - Patient had a fall about 2 weeks ago. - Xarelto  is currently on hold. - Neurosurgery team has been consulted.  Acute L1 fracture: - Will defer to the neurosurgery team. - Optimize pain control.  Atrial fibrillation with  rapid ventricular response: - Xarelto  is on hold. - Controlled heart rate. - Optimize pain control. - Last documented heart rate was 87 bpm.  Heart rate has ranged from 65 to 142 bpm.  Hypertension: - Continue to optimize.  Asthma/COPD: - Stable.  DVT prophylaxis: SCD. Code Status: Full code Family Communication: Husband. Disposition Plan: This will depend on hospital course   Consultants:  Neurosurgery (Dr. Debby and Dr. Gillie)  Procedures:  None.  Antimicrobials:  None.   Subjective: No significant history from patient.  Objective: Vitals:   05/28/24 0058 05/28/24 0440 05/28/24 0759 05/28/24 1153  BP: (!) 149/93 (!) 140/80 (!) 155/107 122/66  Pulse: 81 95 65 (!) 142  Resp: 18 18 18 19   Temp: 98.1 F (36.7 C) 97.7 F (36.5 C) (!) 97.3 F (36.3 C) (!) 97.5 F (36.4 C)  TempSrc: Oral Oral Oral Oral  SpO2: 98% 90% 100% 97%  Weight:      Height:        Intake/Output Summary (Last 24 hours) at 05/28/2024 1429 Last data filed at 05/28/2024 0926 Gross per 24 hour  Intake 138.42 ml  Output --  Net 138.42 ml   Filed Weights   05/27/24 1857 05/27/24 2325 05/28/24 0055  Weight: 73.8 kg 74 kg 69.8 kg    Examination:  General exam: Appears calm and comfortable. Respiratory system: Clear to auscultation.  Cardiovascular system: S1 & S2, irregularly irregular.   Gastrointestinal system: Abdomen is soft and nontender. Central nervous system: Awake and alert.  Data Reviewed: I have personally reviewed following labs and imaging studies  CBC: Recent Labs  Lab 05/27/24 1856 05/27/24 1857  WBC 11.8*  --   NEUTROABS 7.3  --   HGB 13.6 14.3  HCT 42.8 42.0  MCV 96.0  --   PLT 264  --    Basic Metabolic Panel: Recent Labs  Lab 05/27/24 1856 05/27/24 1857  NA 132* 135  K 4.0 3.9  CL 102 105  CO2 19*  --   GLUCOSE 115* 116*  BUN 14 14  CREATININE 0.78 0.70  CALCIUM 9.2  --    GFR: Estimated Creatinine Clearance: 47.5 mL/min (by C-G formula  based on SCr of 0.7 mg/dL). Liver Function Tests: Recent Labs  Lab 05/27/24 1856  AST 19  ALT 31  ALKPHOS 201*  BILITOT 0.7  PROT 6.8  ALBUMIN 3.4*   No results for input(s): LIPASE, AMYLASE in the last 168 hours. No results for input(s): AMMONIA in the last 168 hours. Coagulation Profile: Recent Labs  Lab 05/27/24 1856  INR 2.3*   Cardiac Enzymes: No results for input(s): CKTOTAL, CKMB, CKMBINDEX, TROPONINI in the last 168 hours. BNP (last 3 results) No results for input(s): PROBNP in the last 8760 hours. HbA1C: Recent Labs    05/28/24 0255  HGBA1C 5.7*   CBG: Recent Labs  Lab 05/27/24 1850  GLUCAP 113*   Lipid Profile: Recent Labs    05/28/24 0255  CHOL 140  HDL 60  LDLCALC 69  TRIG 55  CHOLHDL 2.3   Thyroid Function Tests: No results for input(s): TSH, T4TOTAL, FREET4, T3FREE, THYROIDAB in the last 72 hours. Anemia Panel: No results for input(s): VITAMINB12, FOLATE, FERRITIN, TIBC, IRON, RETICCTPCT in the last 72 hours. Urine analysis: No results found for: COLORURINE, APPEARANCEUR, LABSPEC, PHURINE, GLUCOSEU, HGBUR, BILIRUBINUR, KETONESUR, PROTEINUR, UROBILINOGEN, NITRITE, LEUKOCYTESUR Sepsis Labs: @LABRCNTIP (procalcitonin:4,lacticidven:4)  )No results found for this or any previous visit (from the past 240 hours).       Radiology Studies: CT HEAD WO CONTRAST ( ) Result Date: 05/28/2024 CLINICAL DATA:  Subdural hematoma EXAM: CT HEAD WITHOUT CONTRAST TECHNIQUE: Contiguous axial images were obtained from the base of the skull through the vertex without intravenous contrast. RADIATION DOSE REDUCTION: This exam was performed according to the departmental dose-optimization program which includes automated exposure control, adjustment of the mA and/or kV according to patient size and/or use of iterative reconstruction technique. COMPARISON:  CT head 05/27/2024 and MRI head 05/27/2024.  FINDINGS: Brain: Redemonstrated subdural hematoma over the left cerebral convexity which appears slightly increased in size now measuring up to 9 mm in thickness, previously 7 mm. Mild associated mass effect. No midline shift. No additional areas of hemorrhage identified. Nonspecific hypoattenuation in the periventricular and subcortical white matter favored to reflect chronic microvascular ischemic changes. Remote lacunar infarct in the right thalamus. Mild parenchymal volume loss. Similar meningioma along the right petrous ridge. Ventricles: The ventricles are normal. Vascular: Atherosclerotic calcifications of the carotid siphons. No hyperdense vessel. Skull: No acute or aggressive finding. Orbits: Bilateral lens replacement. Sinuses: Mucosal thickening in the right maxillary sinus. Other: Mastoid air cells are clear. IMPRESSION: Left cerebral convexity subdural hematoma is slightly increased now measuring up to 9 mm, previously 7 mm. Mild local mass effect. No midline shift. Recommend short interval follow-up head CT. Chronic microvascular ischemic changes. Remote lacunar infarct in the right thalamus. Similar meningioma along the right petrous ridge. Electronically Signed   By: Donnice Mania M.D.   On: 05/28/2024 10:52   EEG  adult Result Date: 05/28/2024 Shelton Arlin KIDD, MD     05/28/2024  4:28 AM Patient Name: Elvis Boot MRN: [id number] Epilepsy Attending: Arlin KIDD Shelton Referring Physician/Provider: Voncile Isles, MD Date: 05/28/2024 Duration: 23.13 mins Patient history: 89 y.o. female past history of paroxysmal atrial fibrillation on Xarelto  presenting for evaluation of sudden onset of slurred speech. EEG to evaluate for seizure. Level of alertness: Awake, drowsy AEDs during EEG study: Ativan  Technical aspects: This EEG study was done with scalp electrodes positioned according to the 10-20 International system of electrode placement. Electrical activity was reviewed with band pass filter of 1-70Hz ,  sensitivity of 7 uV/mm, display speed of 34mm/sec with a 60Hz  notched filter applied as appropriate. EEG data were recorded continuously and digitally stored.  Video monitoring was available and reviewed as appropriate. Description: The posterior dominant rhythm consists of 9 Hz activity of moderate voltage (25-35 uV) seen predominantly in posterior head regions, symmetric and reactive to eye opening and eye closing. Drowsiness was characterized by attenuation of the posterior background rhythm. EEG showed intermittent generalized 3 to 6 Hz theta-delta slowing. Hyperventilation and photic stimulation were not performed.   ABNORMALITY - Intermittent slow, generalized IMPRESSION: This study is suggestive of mild diffuse encephalopathy. No seizures or epileptiform discharges were seen throughout the recording. Arlin KIDD Shelton   MR Lumbar Spine W Wo Contrast Result Date: 05/28/2024 CLINICAL DATA:  Lumbar radiculopathy, symptoms persist with > 6 wks treatment EXAM: MRI LUMBAR SPINE WITHOUT AND WITH CONTRAST TECHNIQUE: Multiplanar and multiecho pulse sequences of the lumbar spine were obtained without and with intravenous contrast. CONTRAST:  8mL GADAVIST GADOBUTROL 1 MMOL/ML IV SOLN COMPARISON:  MRI lumbar spine 07/23/2023. FINDINGS: Segmentation: Standard. Alignment:  Grade 1 anterolisthesis of L4 on L5, similar. Vertebrae: Acute/recent L1 fracture with associated marrow edema and approximately 45% height loss. No significant bony retropulsion. L4-L5 PLIF. Conus medullaris and cauda equina: Conus extends to the T12-L1 level. Conus and cauda equina appear normal. Paraspinal and other soft tissues: Mild paraspinal edema surrounding L1 fracture. Disc levels: T12-L1: No significant disc protrusion, foraminal stenosis, or canal stenosis. L1-L2: No significant disc protrusion, foraminal stenosis, or canal stenosis. L2-L3: No significant disc protrusion, foraminal stenosis, or canal stenosis. L3-L4: Disc bulging ligamentum  flavum thickening. Progressive mild canal stenosis. Similar moderate right and mild left foraminal stenosis. L4-L5: PLIF.  Patent canal and foramina. L5-S1: Patent canal and foramina. IMPRESSION: 1. Acute/recent L1 compression fracture with 45% height loss. No significant bony retropulsion. 2. At L3-L4, progressive mild canal stenosis. Similar moderate right and mild left foraminal stenosis. 3. L4-L5 PLIF. Electronically Signed   By: Gilmore GORMAN Molt M.D.   On: 05/28/2024 02:15   MR BRAIN WO CONTRAST Result Date: 05/27/2024 CLINICAL DATA:  Subdural hematoma EXAM: MRI HEAD WITHOUT CONTRAST TECHNIQUE: Multiplanar, multiecho pulse sequences of the brain and surrounding structures were obtained without intravenous contrast. COMPARISON:  Same day CT head. FINDINGS: Brain: When comparing across modalities, similar size of an 8 mm thick left cerebral convexity subdural hematoma with suspected subjacent small volume of subarachnoid hemorrhage. Similar mass effect. No evidence of acute infarct, mass lesion or hydrocephalus. Approximately 1 cm extra-axial dural-based mass along the petrous right temporal bone, compatible with meningioma without mass effect. Vascular: Major arterial flow voids are maintained at the skull base. Skull and upper cervical spine: Normal marrow signal. Sinuses/Orbits: Right maxillary sinus partial opacification with air-fluid level. IMPRESSION: 1. When comparing across modalities, similar size of an 8 mm thick left cerebral convexity subdural hematoma with  suspected subjacent small volume of subarachnoid hemorrhage. Similar mass effect. 2. No evidence of acute infarct. 3. Approximately 1 cm meningioma along the right petrous bone without mass effect. 4. Right maxillary sinus partial opacification and air-fluid level. Electronically Signed   By: Gilmore GORMAN Molt M.D.   On: 05/27/2024 22:17   CT ANGIO HEAD NECK W WO CM (CODE STROKE) Result Date: 05/27/2024 CLINICAL DATA:  Neuro deficit, acute,  stroke suspected EXAM: CT ANGIOGRAPHY HEAD AND NECK WITH AND WITHOUT CONTRAST TECHNIQUE: Multidetector CT imaging of the head and neck was performed using the standard protocol during bolus administration of intravenous contrast. Multiplanar CT image reconstructions and MIPs were obtained to evaluate the vascular anatomy. Carotid stenosis measurements (when applicable) are obtained utilizing NASCET criteria, using the distal internal carotid diameter as the denominator. RADIATION DOSE REDUCTION: This exam was performed according to the departmental dose-optimization program which includes automated exposure control, adjustment of the mA and/or kV according to patient size and/or use of iterative reconstruction technique. CONTRAST:  75mL OMNIPAQUE  IOHEXOL  350 MG/ML SOLN COMPARISON:  Same day CT head. FINDINGS: CTA NECK FINDINGS Aortic arch: Aortic atherosclerosis. Great vessel origins are patent. Right carotid system: No evidence of dissection, stenosis (50% or greater), or occlusion. Left carotid system: No evidence of dissection, stenosis (50% or greater), or occlusion. Vertebral arteries: Left dominant. No evidence of dissection, stenosis (50% or greater), or occlusion. Skeleton: No acute fracture. Other neck: No acute abnormality on limited assessment. Enlarged and heterogeneous thyroid, which is further evaluated on prior ultrasound from February 18, 2024. Upper chest: Visualized lung apices are clear. Review of the MIP images confirms the above findings CTA HEAD FINDINGS Anterior circulation: Bilateral intracranial ICAs, MCAs, and ACAs are patent without proximal hemodynamically significant stenosis. No aneurysm identified. Posterior circulation: Bilateral intradural vertebral arteries, basilar artery and bilateral posterior cerebral arteries are patent. Moderate left P2 PCA stenosis. No aneurysm identified. Venous sinuses: As permitted by contrast timing, patent. Review of the MIP images confirms the above  findings IMPRESSION: 1. No emergent large vessel occlusion. 2. Moderate left P2 PCA stenosis. 3. Aortic Atherosclerosis (ICD10-I70.0). Electronically Signed   By: Gilmore GORMAN Molt M.D.   On: 05/27/2024 19:22   CT HEAD CODE STROKE WO CONTRAST Result Date: 05/27/2024 CLINICAL DATA:  Code stroke.  Neuro deficit, acute, stroke suspected EXAM: CT HEAD WITHOUT CONTRAST TECHNIQUE: Contiguous axial images were obtained from the base of the skull through the vertex without intravenous contrast. RADIATION DOSE REDUCTION: This exam was performed according to the departmental dose-optimization program which includes automated exposure control, adjustment of the mA and/or kV according to patient size and/or use of iterative reconstruction technique. COMPARISON:  CT head August 21, 2019. FINDINGS: Brain: Approximately 7 mm thick mixed density left cerebral convexity subdural hematoma with areas of mild hyperdensity suggesting a component of recent hemorrhage. No substantial midline shift. No evidence of acute infarction, hydrocephalus, extra-axial collection. Remote lacunar infarct in the right thalamus. Patchy white matter hypodensities, compatible with chronic microvascular ischemic disease. Similar approximately 1 cm meningioma along the right petrous bone (series 3, image 7). Vascular: No hyperdense vessel. Skull: No acute fracture. Sinuses/Orbits: Moderate paranasal sinus mucosal thickening the right maxillary sinus. No acute orbital findings. Other: No mastoid effusions. ASPECTS [facility] Stroke Program Early CT Score) Total score (0-10 with 10 being normal): 10. IMPRESSION: 1. Approximately 7 mm thick mixed density left cerebral convexity subdural hematoma with areas of mild hyperdensity suggesting a component of recent hemorrhage. No substantial midline shift. 2. No evidence  of acute large vascular territory infarct. Chronic microvascular ischemic disease and remote right thalamic infarct. 3. Similar approximately 1  cm meningioma along the right petrous bone. Code stroke imaging results were communicated on 05/27/2024 at 7:16 pm to provider Dr. Voncile via telephone. Electronically Signed   By: Gilmore GORMAN Molt M.D.   On: 05/27/2024 19:17        Scheduled Meds:  calcium carbonate  500 mg Oral QAC supper   cycloSPORINE  1 drop Both Eyes BID   diltiazem  120 mg Oral Daily   gabapentin  600 mg Oral Daily   melatonin  5 mg Oral QHS   Continuous Infusions:  sodium chloride Stopped (05/28/24 0525)     LOS: 0 days    Time spent: 55 minutes.    Leatrice Chapel, MD  [facility] Pager #: [phone] 7PM-7AM contact night coverage as above

## 2024-05-28 NOTE — Plan of Care (Signed)
 Pt agitated, restless. Pulling at lines, telemetry leads & IV line. Frequently attempting to get OOB. Very anxious, not easily consoled by husband at bedside or staff. PRN IV ativan  given for agitation/anxiety & PRN IV morphine  given for pt reporting pain. Neither have successfully relieved agitation/restlessness. On-call Dr. Charlton notified. Bed alarm activated, fall precautions in place.

## 2024-05-28 NOTE — Progress Notes (Signed)
 Per patients caregiver, patient saw Dr. Rosine Lucas Roll with [facility] in [place], [place] earlier this week.

## 2024-05-28 NOTE — Care Management Obs Status (Signed)
 MEDICARE OBSERVATION STATUS NOTIFICATION   Patient Details  Name: Katherine Porter MRN: [id number] Date of Birth: [birth year]   Medicare Observation Status Notification Given:  Yes  Obs letter signed and copy given  Claretta Deed 05/28/2024, 1:23 PM

## 2024-05-28 NOTE — Progress Notes (Signed)
 EEG complete - results pending

## 2024-05-28 NOTE — Plan of Care (Signed)
 EEG unremarkable for acute process. Repeat CTH pending. Management per primary team and neurosurg. Please call neurology with questions as needed   -- Eligio Lav, MD Neurologist [facility]

## 2024-05-28 NOTE — Progress Notes (Addendum)
 Transition of Care [facility]) - Inpatient Brief Assessment   Patient Details  Name: Katherine Porter MRN: [id number] Date of Birth: [birth year]  Transition of Care [facility]) CM/SW Contact:    Rosaline JONELLE Joe, RN Phone Number: 05/28/2024, 4:20 PM   Clinical Narrative: Patient admitted to the hospital with TIA.  No IP Care management needs at this time.  PT/Ot evaluation is pending at this time.  Please place [facility] consult as needs arise.   Transition of Care Asessment: Insurance and Status: (P) Insurance coverage has been reviewed Patient has primary care physician: (P) Yes Home environment has been reviewed: (P) from home with spouse     Social Drivers of Health Review: (P) SDOH reviewed no interventions necessary Readmission risk has been reviewed: (P) Yes Transition of care needs: (P) no transition of care needs at this time

## 2024-05-28 NOTE — Progress Notes (Addendum)
 PT Cancellation Note  Patient Details Name: Katherine Porter MRN: [id number] DOB: 1935/07/03   Cancelled Treatment:    Reason Eval/Treat Not Completed: (P) Other (comment). MRI showing an acute L1 fx. Contacted attending MD, Dr. Rosario, and MD for neurology, Dr. Voncile, who suggested reaching out to neurosurgery. Dr. Gillie was neurosurgeon on call today. Sent a page to number on file, but number came back as invalid. Notified attending MD. Will plan to hold off on PT until can confirm a need for a back brace or not to mobilize.    [name], PT, DPT Acute Rehabilitation Services  Office: [phone]      [name] 05/28/2024, 8:23 AM

## 2024-05-28 NOTE — Plan of Care (Signed)

## 2024-05-28 NOTE — Consult Note (Signed)
 HPI:     Patient is a 89 y.o. female who presented to ED w/ c/o dysarthria per family. They note recent fall about 2 weeks ago. Workup revealing L1 compression fracture, and R SDH. Maintained on Xarelto  for afib.     Patient Active Problem List   Diagnosis Date Noted   TIA (transient ischemic attack) 05/27/2024   Subdural hematoma (HCC) 05/27/2024   Acute midline low back pain without sciatica 05/24/2024   Abnormality of gait and mobility 11/27/2023   Essential hypertension 04/05/2019   Paroxysmal A-fib (HCC) 08/09/2016   History of DVT of lower extremity 08/08/2016   Asthma with COPD (chronic obstructive pulmonary disease) (HCC) 10/04/2014   Lumbar radiculopathy 07/07/2014   Esophageal reflux 07/07/2014   Hyperlipidemia 07/07/2014   Past Medical History:  Diagnosis Date   Atrial fibrillation (HCC)    DVT (deep venous thrombosis) (HCC)     Past Surgical History:  Procedure Laterality Date   APPENDECTOMY     BREAST SURGERY     spleenectomy      Medications Prior to Admission  Medication Sig Dispense Refill Last Dose/Taking   acetaminophen  (TYLENOL ) 500 MG tablet Take 1,000 mg by mouth in the morning, at noon, and at bedtime.   05/27/2024 Noon   atorvastatin (LIPITOR) 40 MG tablet Take 40 mg by mouth at bedtime.   05/26/2024   bisacodyl (DULCOLAX) 10 MG suppository Place 10 mg rectally daily as needed for moderate constipation.   05/22/2024   calcitonin, salmon, (MIACALCIN/FORTICAL) 200 UNIT/ACT nasal spray Place 1 spray into alternate nostrils daily.   05/27/2024 Morning   Calcium Carbonate-Vit D-Min (CALCIUM 600+D PLUS MINERALS) 600-400 MG-UNIT TABS Take 1 tablet by mouth in the morning and at bedtime.   05/27/2024 Morning   Cyanocobalamin 1000 MCG SUBL Take 1 tablet by mouth daily.   05/27/2024 Morning   cycloSPORINE (RESTASIS) 0.05 % ophthalmic emulsion Place 1 drop into both eyes 2 (two) times daily.   05/27/2024 Morning   diltiazem (CARDIZEM CD) 120 MG 24 hr capsule Take 120 mg  by mouth daily.   05/27/2024 Morning   gabapentin (NEURONTIN) 100 MG capsule Take 100 mg by mouth in the morning.   05/27/2024 Morning   gabapentin (NEURONTIN) 600 MG tablet Take 600 mg by mouth at bedtime.   05/26/2024   guaiFENesin (ROBITUSSIN) 100 MG/5ML liquid Take 10 mLs by mouth every 4 (four) hours as needed for cough or to loosen phlegm.   05/27/2024 Morning   lidocaine  4 % Place 1 patch onto the skin daily.   05/27/2024 Morning   methocarbamol (ROBAXIN) 500 MG tablet Take 250 mg by mouth in the morning, at noon, and at bedtime.   05/22/2024   Multiple Vitamins-Minerals (MULTIVITAMIN ADULT PO) Take 1 tablet by mouth daily.   05/27/2024 Morning   neomycin-polymyxin b-dexamethasone (MAXITROL) 3.5-10000-0.1 OINT Place 1 Application into both eyes at bedtime.   05/26/2024   Omega-3 Fatty Acids (FISH OIL) 1000 MG CAPS Take 1 capsule by mouth daily.   05/27/2024 Morning   oxyCODONE (OXY IR/ROXICODONE) 5 MG immediate release tablet Take 5 mg by mouth in the morning, at noon, and at bedtime.   05/27/2024 Noon   oxyCODONE (OXY IR/ROXICODONE) 5 MG immediate release tablet Take 5 mg by mouth every 6 (six) hours as needed for severe pain (pain score 7-10) or moderate pain (pain score 4-6) (acute surgical pain of the pelvis).   05/25/2024   Polyethyl Glyc-Propyl Glyc PF (SYSTANE PRESERVATIVE FREE) 0.4-0.3 % SOLN Place 1  drop into both eyes 2 (two) times daily as needed (Dry eyes).   Unknown   polyethylene glycol (MIRALAX / GLYCOLAX) 17 g packet Take 17 g by mouth daily. Take 17g by mouth once daily.  May take one additional dose as needed for constipation.   05/27/2024 Morning   senna-docusate (SENOKOT-S) 8.6-50 MG tablet Take 1 tablet by mouth every other day.   05/26/2024   sodium chloride (OCEAN) 0.65 % SOLN nasal spray Place 1 spray into both nostrils 2 (two) times daily as needed for congestion.   05/27/2024 Morning   XARELTO  20 MG TABS tablet Take 20 mg by mouth daily.   05/27/2024 Morning   Allergies  Allergen  Reactions   Aspirin Other (See Comments)    Pt is unable to take due to contraindication with other meds   Naproxen Other (See Comments)    Contraindicated due to medication list   Other Other (See Comments)    bruising   Tramadol Other (See Comments) and Nausea And Vomiting   Wound Dressing Adhesive Other (See Comments)    bruising   Codeine Nausea Only   Baclofen Other (See Comments)    Drowsiness   Drowsiness    Social History   Tobacco Use   Smoking status: Former   Smokeless tobacco: Never  Substance Use Topics   Alcohol use: Yes    Alcohol/week: 2.0 standard drinks of alcohol    Types: 2 Glasses of wine per week    Comment: 5 days a week    History reviewed. No pertinent family history.    Objective:   Patient Vitals for the past 8 hrs:  BP Temp Temp src Pulse Resp SpO2  05/28/24 1429 -- -- -- 87 18 --  05/28/24 1153 122/66 (!) 97.5 F (36.4 C) Oral (!) 142 19 97 %  05/28/24 0759 (!) 155/107 (!) 97.3 F (36.3 C) Oral 65 18 100 %  CT HEAD WO CONTRAST ( ) Result Date: 05/28/2024 CLINICAL DATA:  Subdural hematoma EXAM: CT HEAD WITHOUT CONTRAST TECHNIQUE: Contiguous axial images were obtained from the base of the skull through the vertex without intravenous contrast. RADIATION DOSE REDUCTION: This exam was performed according to the departmental dose-optimization program which includes automated exposure control, adjustment of the mA and/or kV according to patient size and/or use of iterative reconstruction technique. COMPARISON:  CT head 05/27/2024 and MRI head 05/27/2024. FINDINGS: Brain: Redemonstrated subdural hematoma over the left cerebral convexity which appears slightly increased in size now measuring up to 9 mm in thickness, previously 7 mm. Mild associated mass effect. No midline shift. No additional areas of hemorrhage identified. Nonspecific hypoattenuation in the periventricular and subcortical white matter favored to reflect chronic microvascular ischemic  changes. Remote lacunar infarct in the right thalamus. Mild parenchymal volume loss. Similar meningioma along the right petrous ridge. Ventricles: The ventricles are normal. Vascular: Atherosclerotic calcifications of the carotid siphons. No hyperdense vessel. Skull: No acute or aggressive finding. Orbits: Bilateral lens replacement. Sinuses: Mucosal thickening in the right maxillary sinus. Other: Mastoid air cells are clear. IMPRESSION: Left cerebral convexity subdural hematoma is slightly increased now measuring up to 9 mm, previously 7 mm. Mild local mass effect. No midline shift. Recommend short interval follow-up head CT. Chronic microvascular ischemic changes. Remote lacunar infarct in the right thalamus. Similar meningioma along the right petrous ridge. Electronically Signed   By: Donnice Mania M.D.   On: 05/28/2024 10:52   EEG adult Result Date: 05/28/2024 Shelton Arlin KIDD, MD  05/28/2024  4:28 AM Patient Name: Constancia Geeting MRN: [id number] Epilepsy Attending: Arlin MALVA Krebs Referring Physician/Provider: Voncile Isles, MD Date: 05/28/2024 Duration: 23.13 mins Patient history: 89 y.o. female past history of paroxysmal atrial fibrillation on Xarelto  presenting for evaluation of sudden onset of slurred speech. EEG to evaluate for seizure. Level of alertness: Awake, drowsy AEDs during EEG study: Ativan  Technical aspects: This EEG study was done with scalp electrodes positioned according to the 10-20 International system of electrode placement. Electrical activity was reviewed with band pass filter of 1-70Hz , sensitivity of 7 uV/mm, display speed of 40mm/sec with a 60Hz  notched filter applied as appropriate. EEG data were recorded continuously and digitally stored.  Video monitoring was available and reviewed as appropriate. Description: The posterior dominant rhythm consists of 9 Hz activity of moderate voltage (25-35 uV) seen predominantly in posterior head regions, symmetric and reactive to eye opening  and eye closing. Drowsiness was characterized by attenuation of the posterior background rhythm. EEG showed intermittent generalized 3 to 6 Hz theta-delta slowing. Hyperventilation and photic stimulation were not performed.   ABNORMALITY - Intermittent slow, generalized IMPRESSION: This study is suggestive of mild diffuse encephalopathy. No seizures or epileptiform discharges were seen throughout the recording. Arlin MALVA Krebs   MR Lumbar Spine W Wo Contrast Result Date: 05/28/2024 CLINICAL DATA:  Lumbar radiculopathy, symptoms persist with > 6 wks treatment EXAM: MRI LUMBAR SPINE WITHOUT AND WITH CONTRAST TECHNIQUE: Multiplanar and multiecho pulse sequences of the lumbar spine were obtained without and with intravenous contrast. CONTRAST:  8mL GADAVIST GADOBUTROL 1 MMOL/ML IV SOLN COMPARISON:  MRI lumbar spine 07/23/2023. FINDINGS: Segmentation: Standard. Alignment:  Grade 1 anterolisthesis of L4 on L5, similar. Vertebrae: Acute/recent L1 fracture with associated marrow edema and approximately 45% height loss. No significant bony retropulsion. L4-L5 PLIF. Conus medullaris and cauda equina: Conus extends to the T12-L1 level. Conus and cauda equina appear normal. Paraspinal and other soft tissues: Mild paraspinal edema surrounding L1 fracture. Disc levels: T12-L1: No significant disc protrusion, foraminal stenosis, or canal stenosis. L1-L2: No significant disc protrusion, foraminal stenosis, or canal stenosis. L2-L3: No significant disc protrusion, foraminal stenosis, or canal stenosis. L3-L4: Disc bulging ligamentum flavum thickening. Progressive mild canal stenosis. Similar moderate right and mild left foraminal stenosis. L4-L5: PLIF.  Patent canal and foramina. L5-S1: Patent canal and foramina. IMPRESSION: 1. Acute/recent L1 compression fracture with 45% height loss. No significant bony retropulsion. 2. At L3-L4, progressive mild canal stenosis. Similar moderate right and mild left foraminal stenosis. 3. L4-L5  PLIF. Electronically Signed   By: Gilmore GORMAN Molt M.D.   On: 05/28/2024 02:15   MR BRAIN WO CONTRAST Result Date: 05/27/2024 CLINICAL DATA:  Subdural hematoma EXAM: MRI HEAD WITHOUT CONTRAST TECHNIQUE: Multiplanar, multiecho pulse sequences of the brain and surrounding structures were obtained without intravenous contrast. COMPARISON:  Same day CT head. FINDINGS: Brain: When comparing across modalities, similar size of an 8 mm thick left cerebral convexity subdural hematoma with suspected subjacent small volume of subarachnoid hemorrhage. Similar mass effect. No evidence of acute infarct, mass lesion or hydrocephalus. Approximately 1 cm extra-axial dural-based mass along the petrous right temporal bone, compatible with meningioma without mass effect. Vascular: Major arterial flow voids are maintained at the skull base. Skull and upper cervical spine: Normal marrow signal. Sinuses/Orbits: Right maxillary sinus partial opacification with air-fluid level. IMPRESSION: 1. When comparing across modalities, similar size of an 8 mm thick left cerebral convexity subdural hematoma with suspected subjacent small volume of subarachnoid hemorrhage. Similar mass effect. 2. No  evidence of acute infarct. 3. Approximately 1 cm meningioma along the right petrous bone without mass effect. 4. Right maxillary sinus partial opacification and air-fluid level. Electronically Signed   By: Gilmore GORMAN Molt M.D.   On: 05/27/2024 22:17   CT ANGIO HEAD NECK W WO CM (CODE STROKE) Result Date: 05/27/2024 CLINICAL DATA:  Neuro deficit, acute, stroke suspected EXAM: CT ANGIOGRAPHY HEAD AND NECK WITH AND WITHOUT CONTRAST TECHNIQUE: Multidetector CT imaging of the head and neck was performed using the standard protocol during bolus administration of intravenous contrast. Multiplanar CT image reconstructions and MIPs were obtained to evaluate the vascular anatomy. Carotid stenosis measurements (when applicable) are obtained utilizing NASCET  criteria, using the distal internal carotid diameter as the denominator. RADIATION DOSE REDUCTION: This exam was performed according to the departmental dose-optimization program which includes automated exposure control, adjustment of the mA and/or kV according to patient size and/or use of iterative reconstruction technique. CONTRAST:  75mL OMNIPAQUE  IOHEXOL  350 MG/ML SOLN COMPARISON:  Same day CT head. FINDINGS: CTA NECK FINDINGS Aortic arch: Aortic atherosclerosis. Great vessel origins are patent. Right carotid system: No evidence of dissection, stenosis (50% or greater), or occlusion. Left carotid system: No evidence of dissection, stenosis (50% or greater), or occlusion. Vertebral arteries: Left dominant. No evidence of dissection, stenosis (50% or greater), or occlusion. Skeleton: No acute fracture. Other neck: No acute abnormality on limited assessment. Enlarged and heterogeneous thyroid, which is further evaluated on prior ultrasound from February 18, 2024. Upper chest: Visualized lung apices are clear. Review of the MIP images confirms the above findings CTA HEAD FINDINGS Anterior circulation: Bilateral intracranial ICAs, MCAs, and ACAs are patent without proximal hemodynamically significant stenosis. No aneurysm identified. Posterior circulation: Bilateral intradural vertebral arteries, basilar artery and bilateral posterior cerebral arteries are patent. Moderate left P2 PCA stenosis. No aneurysm identified. Venous sinuses: As permitted by contrast timing, patent. Review of the MIP images confirms the above findings IMPRESSION: 1. No emergent large vessel occlusion. 2. Moderate left P2 PCA stenosis. 3. Aortic Atherosclerosis (ICD10-I70.0). Electronically Signed   By: Gilmore GORMAN Molt M.D.   On: 05/27/2024 19:22   CT HEAD CODE STROKE WO CONTRAST Result Date: 05/27/2024 CLINICAL DATA:  Code stroke.  Neuro deficit, acute, stroke suspected EXAM: CT HEAD WITHOUT CONTRAST TECHNIQUE: Contiguous axial images  were obtained from the base of the skull through the vertex without intravenous contrast. RADIATION DOSE REDUCTION: This exam was performed according to the departmental dose-optimization program which includes automated exposure control, adjustment of the mA and/or kV according to patient size and/or use of iterative reconstruction technique. COMPARISON:  CT head August 21, 2019. FINDINGS: Brain: Approximately 7 mm thick mixed density left cerebral convexity subdural hematoma with areas of mild hyperdensity suggesting a component of recent hemorrhage. No substantial midline shift. No evidence of acute infarction, hydrocephalus, extra-axial collection. Remote lacunar infarct in the right thalamus. Patchy white matter hypodensities, compatible with chronic microvascular ischemic disease. Similar approximately 1 cm meningioma along the right petrous bone (series 3, image 7). Vascular: No hyperdense vessel. Skull: No acute fracture. Sinuses/Orbits: Moderate paranasal sinus mucosal thickening the right maxillary sinus. No acute orbital findings. Other: No mastoid effusions. ASPECTS [facility] Stroke Program Early CT Score) Total score (0-10 with 10 being normal): 10. IMPRESSION: 1. Approximately 7 mm thick mixed density left cerebral convexity subdural hematoma with areas of mild hyperdensity suggesting a component of recent hemorrhage. No substantial midline shift. 2. No evidence of acute large vascular territory infarct. Chronic microvascular ischemic disease and remote  right thalamic infarct. 3. Similar approximately 1 cm meningioma along the right petrous bone. Code stroke imaging results were communicated on 05/27/2024 at 7:16 pm to provider Dr. Voncile via telephone. Electronically Signed   By: Gilmore GORMAN Molt M.D.   On: 05/27/2024 19:17   Drowsy, easily arouses Speech fluent CN grossly intact MAEs PERRLA Face symmetric  Assessment:   Principal Problem:   TIA (transient ischemic attack) Active  Problems:   Abnormality of gait and mobility   Acute midline low back pain without sciatica   Asthma with COPD (chronic obstructive pulmonary disease) (HCC)   Lumbar radiculopathy   Esophageal reflux   Essential hypertension   History of DVT of lower extremity   Hyperlipidemia   Paroxysmal A-fib (HCC)   Subdural hematoma (HCC)  This is a 89 yo with a h/o recent fall with subacute L SDH noted on CTH w/ incremental change noted on repeat scan with unchanged neurologic status. She also has an acute L1 compression fracture without posterior element involvement which appears to have been previously diagnosed and managed by her primary neurosurgeon, Dr. Jacklyn, w/ [facility]. She has been previously given a TLSO for this fracture per EMR review.   Plan:   -Will order short interval repeat CTH to evaluate for progression. Pending stability of this scan, patient should continue to hold Xarelto  and follow up as an outpatient with her neurosurgeon Dr. Jacklyn with follow up [facility] in 2 weeks.  -She should continue to wear TLSO brace when OOB, and participate in activity as tolerated using pain as her guide with conservative mgmt of her pain. Continued outpatient follow up per Dr. Jacklyn.   Jaquelynn Wanamaker CAYLIN Shellsea Borunda, PA-C

## 2024-05-29 DIAGNOSIS — G459 Transient cerebral ischemic attack, unspecified: Secondary | ICD-10-CM | POA: Diagnosis not present

## 2024-05-29 MED ORDER — SENNOSIDES-DOCUSATE SODIUM 8.6-50 MG PO TABS: 1.0000 | ORAL_TABLET | Freq: Every evening | ORAL | 0 refills | Status: AC | PRN

## 2024-05-29 MED ORDER — CALCIUM CARBONATE ANTACID 500 MG PO CHEW: 500.0000 mg | CHEWABLE_TABLET | Freq: Every day | ORAL | 0 refills | Status: AC

## 2024-05-29 NOTE — Plan of Care (Signed)
  Problem: Education: Goal: Knowledge of General Education information will improve Description: Including pain rating scale, medication(s)/side effects and non-pharmacologic comfort measures Outcome: Progressing   Problem: Health Behavior/Discharge Planning: Goal: Ability to manage health-related needs will improve Outcome: Progressing   Problem: Clinical Measurements: Goal: Ability to maintain clinical measurements within normal limits will improve Outcome: Progressing Goal: Will remain free from infection Outcome: Progressing Goal: Diagnostic test results will improve Outcome: Progressing Goal: Respiratory complications will improve Outcome: Progressing Goal: Cardiovascular complication will be avoided Outcome: Progressing   Problem: Activity: Goal: Risk for activity intolerance will decrease Outcome: Progressing   Problem: Nutrition: Goal: Adequate nutrition will be maintained Outcome: Progressing   Problem: Coping: Goal: Level of anxiety will decrease Outcome: Progressing   Problem: Elimination: Goal: Will not experience complications related to bowel motility Outcome: Progressing Goal: Will not experience complications related to urinary retention Outcome: Progressing   Problem: Pain Managment: Goal: General experience of comfort will improve and/or be controlled Outcome: Progressing   Problem: Safety: Goal: Ability to remain free from injury will improve Outcome: Progressing   Problem: Skin Integrity: Goal: Risk for impaired skin integrity will decrease Outcome: Progressing   Problem: Education: Goal: Knowledge of disease or condition will improve Outcome: Progressing Goal: Knowledge of secondary prevention will improve (MUST DOCUMENT ALL) Outcome: Progressing Goal: Knowledge of patient specific risk factors will improve (DELETE if not current risk factor) Outcome: Progressing   Problem: Ischemic Stroke/TIA Tissue Perfusion: Goal: Complications of  ischemic stroke/TIA will be minimized Outcome: Progressing   Problem: Coping: Goal: Will verbalize positive feelings about self Outcome: Progressing Goal: Will identify appropriate support needs Outcome: Progressing   Problem: Health Behavior/Discharge Planning: Goal: Ability to manage health-related needs will improve Outcome: Progressing Goal: Goals will be collaboratively established with patient/family Outcome: Progressing   Problem: Self-Care: Goal: Ability to participate in self-care as condition permits will improve Outcome: Progressing Goal: Verbalization of feelings and concerns over difficulty with self-care will improve Outcome: Progressing Goal: Ability to communicate needs accurately will improve Outcome: Progressing   Problem: Nutrition: Goal: Risk of aspiration will decrease Outcome: Progressing Goal: Dietary intake will improve Outcome: Progressing   Problem: Activity: Goal: Ability to tolerate increased activity will improve Outcome: Progressing   Problem: Cardiac: Goal: Ability to achieve and maintain adequate cardiopulmonary perfusion will improve Outcome: Progressing

## 2024-05-29 NOTE — TOC CM/SW Note (Signed)
 Pt has been DC. Referral received to assist pt with [facility] PT/OT. Met with pt, caregiver, and daughter-in-law, Randine, to discuss DC plan and HH. Randine contacted her husband, [name], to discuss the DC plan. Scott asked that I call his sister, Rosina, to discuss the DC plan and HH. Contacted Ashely at [phone]. Rosina reports that her mother lives at [place] with her husband. She reports that pt was at their rehab facility prior to this admission. She reports that the DC plan is for pt to return to the rehab facility. Notified Josie, SW and Dr. Rosario of the DC plan to SNF. Will continue to f/u to assist with the DC plan.

## 2024-05-29 NOTE — Progress Notes (Signed)
 Subjective: The patient is alert and pleasant.  She is a bit confused.  Objective: Vital signs in last 24 hours: Temp:  [97.5 F (36.4 C)-99.2 F (37.3 C)] 97.5 F (36.4 C) (07/26 0802) Pulse Rate:  [87-142] 98 (07/26 0802) Resp:  [17-19] 19 (07/26 0802) BP: (112-137)/(66-91) 137/91 (07/26 0802) SpO2:  [94 %-98 %] 97 % (07/26 0802) Estimated body mass index is 26.41 kg/m as calculated from the following:   Height as of this encounter: 5' 4 (1.626 m).   Weight as of this encounter: 69.8 kg.   Intake/Output from previous day: 07/25 0701 - 07/26 0700 In: 832 [P.O.:120; I.V.:712] Out: 1200 [Urine:1200] Intake/Output this shift: No intake/output data recorded.  Physical exam the patient is alert and pleasant.  She moves all 4 extremities well.  She is a bit confused.  Lab Results: Recent Labs    05/27/24 1856 05/27/24 1857  WBC 11.8*  --   HGB 13.6 14.3  HCT 42.8 42.0  PLT 264  --    BMET Recent Labs    05/27/24 1856 05/27/24 1857  NA 132* 135  K 4.0 3.9  CL 102 105  CO2 19*  --   GLUCOSE 115* 116*  BUN 14 14  CREATININE 0.78 0.70  CALCIUM  9.2  --     Studies/Results: CT HEAD WO CONTRAST ( ) Result Date: 05/28/2024 CLINICAL DATA:  Subdural hematoma EXAM: CT HEAD WITHOUT CONTRAST TECHNIQUE: Contiguous axial images were obtained from the base of the skull through the vertex without intravenous contrast. RADIATION DOSE REDUCTION: This exam was performed according to the departmental dose-optimization program which includes automated exposure control, adjustment of the mA and/or kV according to patient size and/or use of iterative reconstruction technique. COMPARISON:  CT head 05/28/2024. FINDINGS: Brain: Unchanged 9 mm thick left cerebral convexity subdural hematoma. Unchanged mass effect. No evidence of acute large vascular territory infarct, significant midline shift or hydrocephalus. Remote lacunar infarct in the right thalamus. Unchanged approximately 1 cm  meningioma along the right petrous ridge. Vascular: No hyperdense vessel or unexpected calcification. Skull: No acute fracture. Sinuses/Orbits: Right maxillary sinus mucosal thickening. No acute orbital findings. Other: No mastoid effusions. IMPRESSION: Unchanged 9 mm thick left cerebral convexity subdural hematoma. Unchanged mass effect. Electronically Signed   By: Gilmore GORMAN Molt M.D.   On: 05/28/2024 19:28   CT HEAD WO CONTRAST ( ) Result Date: 05/28/2024 CLINICAL DATA:  Subdural hematoma EXAM: CT HEAD WITHOUT CONTRAST TECHNIQUE: Contiguous axial images were obtained from the base of the skull through the vertex without intravenous contrast. RADIATION DOSE REDUCTION: This exam was performed according to the departmental dose-optimization program which includes automated exposure control, adjustment of the mA and/or kV according to patient size and/or use of iterative reconstruction technique. COMPARISON:  CT head 05/27/2024 and MRI head 05/27/2024. FINDINGS: Brain: Redemonstrated subdural hematoma over the left cerebral convexity which appears slightly increased in size now measuring up to 9 mm in thickness, previously 7 mm. Mild associated mass effect. No midline shift. No additional areas of hemorrhage identified. Nonspecific hypoattenuation in the periventricular and subcortical white matter favored to reflect chronic microvascular ischemic changes. Remote lacunar infarct in the right thalamus. Mild parenchymal volume loss. Similar meningioma along the right petrous ridge. Ventricles: The ventricles are normal. Vascular: Atherosclerotic calcifications of the carotid siphons. No hyperdense vessel. Skull: No acute or aggressive finding. Orbits: Bilateral lens replacement. Sinuses: Mucosal thickening in the right maxillary sinus. Other: Mastoid air cells are clear. IMPRESSION: Left cerebral convexity subdural hematoma is slightly  increased now measuring up to 9 mm, previously 7 mm. Mild local mass effect.  No midline shift. Recommend short interval follow-up head CT. Chronic microvascular ischemic changes. Remote lacunar infarct in the right thalamus. Similar meningioma along the right petrous ridge. Electronically Signed   By: Donnice Mania M.D.   On: 05/28/2024 10:52   EEG adult Result Date: 05/28/2024 Shelton Arlin KIDD, MD     05/28/2024  4:28 AM Patient Name: Katherine Porter MRN: [id number] Epilepsy Attending: Arlin KIDD Shelton Referring Physician/Provider: Voncile Isles, MD Date: 05/28/2024 Duration: 23.13 mins Patient history: 89 y.o. female past history of paroxysmal atrial fibrillation on Xarelto  presenting for evaluation of sudden onset of slurred speech. EEG to evaluate for seizure. Level of alertness: Awake, drowsy AEDs during EEG study: Ativan  Technical aspects: This EEG study was done with scalp electrodes positioned according to the 10-20 International system of electrode placement. Electrical activity was reviewed with band pass filter of 1-70Hz , sensitivity of 7 uV/mm, display speed of 49mm/sec with a 60Hz  notched filter applied as appropriate. EEG data were recorded continuously and digitally stored.  Video monitoring was available and reviewed as appropriate. Description: The posterior dominant rhythm consists of 9 Hz activity of moderate voltage (25-35 uV) seen predominantly in posterior head regions, symmetric and reactive to eye opening and eye closing. Drowsiness was characterized by attenuation of the posterior background rhythm. EEG showed intermittent generalized 3 to 6 Hz theta-delta slowing. Hyperventilation and photic stimulation were not performed.   ABNORMALITY - Intermittent slow, generalized IMPRESSION: This study is suggestive of mild diffuse encephalopathy. No seizures or epileptiform discharges were seen throughout the recording. Arlin KIDD Shelton   MR Lumbar Spine W Wo Contrast Result Date: 05/28/2024 CLINICAL DATA:  Lumbar radiculopathy, symptoms persist with > 6 wks treatment  EXAM: MRI LUMBAR SPINE WITHOUT AND WITH CONTRAST TECHNIQUE: Multiplanar and multiecho pulse sequences of the lumbar spine were obtained without and with intravenous contrast. CONTRAST:  8mL GADAVIST  GADOBUTROL  1 MMOL/ML IV SOLN COMPARISON:  MRI lumbar spine 07/23/2023. FINDINGS: Segmentation: Standard. Alignment:  Grade 1 anterolisthesis of L4 on L5, similar. Vertebrae: Acute/recent L1 fracture with associated marrow edema and approximately 45% height loss. No significant bony retropulsion. L4-L5 PLIF. Conus medullaris and cauda equina: Conus extends to the T12-L1 level. Conus and cauda equina appear normal. Paraspinal and other soft tissues: Mild paraspinal edema surrounding L1 fracture. Disc levels: T12-L1: No significant disc protrusion, foraminal stenosis, or canal stenosis. L1-L2: No significant disc protrusion, foraminal stenosis, or canal stenosis. L2-L3: No significant disc protrusion, foraminal stenosis, or canal stenosis. L3-L4: Disc bulging ligamentum flavum thickening. Progressive mild canal stenosis. Similar moderate right and mild left foraminal stenosis. L4-L5: PLIF.  Patent canal and foramina. L5-S1: Patent canal and foramina. IMPRESSION: 1. Acute/recent L1 compression fracture with 45% height loss. No significant bony retropulsion. 2. At L3-L4, progressive mild canal stenosis. Similar moderate right and mild left foraminal stenosis. 3. L4-L5 PLIF. Electronically Signed   By: Gilmore GORMAN Molt M.D.   On: 05/28/2024 02:15   MR BRAIN WO CONTRAST Result Date: 05/27/2024 CLINICAL DATA:  Subdural hematoma EXAM: MRI HEAD WITHOUT CONTRAST TECHNIQUE: Multiplanar, multiecho pulse sequences of the brain and surrounding structures were obtained without intravenous contrast. COMPARISON:  Same day CT head. FINDINGS: Brain: When comparing across modalities, similar size of an 8 mm thick left cerebral convexity subdural hematoma with suspected subjacent small volume of subarachnoid hemorrhage. Similar mass  effect. No evidence of acute infarct, mass lesion or hydrocephalus. Approximately 1 cm extra-axial dural-based mass  along the petrous right temporal bone, compatible with meningioma without mass effect. Vascular: Major arterial flow voids are maintained at the skull base. Skull and upper cervical spine: Normal marrow signal. Sinuses/Orbits: Right maxillary sinus partial opacification with air-fluid level. IMPRESSION: 1. When comparing across modalities, similar size of an 8 mm thick left cerebral convexity subdural hematoma with suspected subjacent small volume of subarachnoid hemorrhage. Similar mass effect. 2. No evidence of acute infarct. 3. Approximately 1 cm meningioma along the right petrous bone without mass effect. 4. Right maxillary sinus partial opacification and air-fluid level. Electronically Signed   By: Gilmore GORMAN Molt M.D.   On: 05/27/2024 22:17   CT ANGIO HEAD NECK W WO CM (CODE STROKE) Result Date: 05/27/2024 CLINICAL DATA:  Neuro deficit, acute, stroke suspected EXAM: CT ANGIOGRAPHY HEAD AND NECK WITH AND WITHOUT CONTRAST TECHNIQUE: Multidetector CT imaging of the head and neck was performed using the standard protocol during bolus administration of intravenous contrast. Multiplanar CT image reconstructions and MIPs were obtained to evaluate the vascular anatomy. Carotid stenosis measurements (when applicable) are obtained utilizing NASCET criteria, using the distal internal carotid diameter as the denominator. RADIATION DOSE REDUCTION: This exam was performed according to the departmental dose-optimization program which includes automated exposure control, adjustment of the mA and/or kV according to patient size and/or use of iterative reconstruction technique. CONTRAST:  75mL OMNIPAQUE  IOHEXOL  350 MG/ML SOLN COMPARISON:  Same day CT head. FINDINGS: CTA NECK FINDINGS Aortic arch: Aortic atherosclerosis. Great vessel origins are patent. Right carotid system: No evidence of dissection, stenosis  (50% or greater), or occlusion. Left carotid system: No evidence of dissection, stenosis (50% or greater), or occlusion. Vertebral arteries: Left dominant. No evidence of dissection, stenosis (50% or greater), or occlusion. Skeleton: No acute fracture. Other neck: No acute abnormality on limited assessment. Enlarged and heterogeneous thyroid, which is further evaluated on prior ultrasound from February 18, 2024. Upper chest: Visualized lung apices are clear. Review of the MIP images confirms the above findings CTA HEAD FINDINGS Anterior circulation: Bilateral intracranial ICAs, MCAs, and ACAs are patent without proximal hemodynamically significant stenosis. No aneurysm identified. Posterior circulation: Bilateral intradural vertebral arteries, basilar artery and bilateral posterior cerebral arteries are patent. Moderate left P2 PCA stenosis. No aneurysm identified. Venous sinuses: As permitted by contrast timing, patent. Review of the MIP images confirms the above findings IMPRESSION: 1. No emergent large vessel occlusion. 2. Moderate left P2 PCA stenosis. 3. Aortic Atherosclerosis (ICD10-I70.0). Electronically Signed   By: Gilmore GORMAN Molt M.D.   On: 05/27/2024 19:22   CT HEAD CODE STROKE WO CONTRAST Result Date: 05/27/2024 CLINICAL DATA:  Code stroke.  Neuro deficit, acute, stroke suspected EXAM: CT HEAD WITHOUT CONTRAST TECHNIQUE: Contiguous axial images were obtained from the base of the skull through the vertex without intravenous contrast. RADIATION DOSE REDUCTION: This exam was performed according to the departmental dose-optimization program which includes automated exposure control, adjustment of the mA and/or kV according to patient size and/or use of iterative reconstruction technique. COMPARISON:  CT head August 21, 2019. FINDINGS: Brain: Approximately 7 mm thick mixed density left cerebral convexity subdural hematoma with areas of mild hyperdensity suggesting a component of recent hemorrhage. No  substantial midline shift. No evidence of acute infarction, hydrocephalus, extra-axial collection. Remote lacunar infarct in the right thalamus. Patchy white matter hypodensities, compatible with chronic microvascular ischemic disease. Similar approximately 1 cm meningioma along the right petrous bone (series 3, image 7). Vascular: No hyperdense vessel. Skull: No acute fracture. Sinuses/Orbits: Moderate paranasal sinus mucosal  thickening the right maxillary sinus. No acute orbital findings. Other: No mastoid effusions. ASPECTS [facility] Stroke Program Early CT Score) Total score (0-10 with 10 being normal): 10. IMPRESSION: 1. Approximately 7 mm thick mixed density left cerebral convexity subdural hematoma with areas of mild hyperdensity suggesting a component of recent hemorrhage. No substantial midline shift. 2. No evidence of acute large vascular territory infarct. Chronic microvascular ischemic disease and remote right thalamic infarct. 3. Similar approximately 1 cm meningioma along the right petrous bone. Code stroke imaging results were communicated on 05/27/2024 at 7:16 pm to provider Dr. Voncile via telephone. Electronically Signed   By: Gilmore GORMAN Molt M.D.   On: 05/27/2024 19:17    Assessment/Plan: Small left subdural hematoma, L1 compression fracture: The plan is for a TLSO for her compression fracture and follow-up CT regarding her small left subdural hematoma.  Evidently she has a [facility]surgeon with [facility], Dr. Jacklyn.  The plan is to follow-up with him.  LOS: 0 days     Reyes JONETTA Budge 05/29/2024, 8:30 AM     Patient ID: Elveria Pellant, female   DOB: 07/25/1935, 89 y.o.   MRN: [id number]

## 2024-05-29 NOTE — Evaluation (Signed)
 Occupational Therapy Evaluation Patient Details Name: Katherine Porter MRN: [id number] DOB: [birth year] Today's Date: 05/29/2024   History of Present Illness   Pt is an 89 yo female presenting [facility] on [facility]24 with acute onset dysarthria, confusion, and severe low back pain. Workup revealed L1 compression fx and L cerebral cortex SDH. PMH of afib, DVT.     Clinical Impressions P admitted for above, PTA pt resided at [facility] and was ind/mod I for mobility and ADLs. Educated pt on back precautions following her workup for L1 fx, she needs max A to don brace and min A to CGA for ADLs. Pt also needing min A to ambulate safely with RW. She does have mild LUE coordination deficits but not enough to impact much of her functional performance. Pt would benefit from continued acute skilled OT services to address listed deficits and progress to next level of care. Recommend pt return to [facility] but skilled rehab portion for increased assist with ADLs and mobility.      If plan is discharge home, recommend the following:   A little help with walking and/or transfers;A little help with bathing/dressing/bathroom;Assistance with cooking/housework;Supervision due to cognitive status;Direct supervision/assist for medications management;Help with stairs or ramp for entrance     Functional Status Assessment   Patient has had a recent decline in their functional status and demonstrates the ability to make significant improvements in function in a reasonable and predictable amount of time.     Equipment Recommendations   Other (comment) (defer to [facility])     Recommendations for Other Services         Precautions/Restrictions   Precautions Precautions: Fall Recall of Precautions/Restrictions: Impaired Precaution/Restrictions Comments: pt with L-1 compression fx Required Braces or Orthoses: Spinal Brace Spinal Brace: Thoracolumbosacral orthotic;Applied in sitting position (when  OOB) Restrictions Weight Bearing Restrictions Per Provider Order: No     Mobility Bed Mobility Overal bed mobility: Needs Assistance Bed Mobility: Rolling, Sidelying to Sit Rolling: Independent Sidelying to sit: Min assist       General bed mobility comments: reinforced education of log roll    Transfers Overall transfer level: Needs assistance Equipment used: Rolling walker (2 wheels) Transfers: Sit to/from Stand Sit to Stand: Contact guard assist           General transfer comment: CGA with cues for hand placement      Balance Overall balance assessment: Needs assistance Sitting-balance support: Feet supported, No upper extremity supported Sitting balance-Leahy Scale: Good     Standing balance support: During functional activity, Reliant on assistive device for balance Standing balance-Leahy Scale: Fair Standing balance comment: needs outside assist                           ADL either performed or assessed with clinical judgement   ADL Overall ADL's : Needs assistance/impaired Eating/Feeding: Independent;Sitting   Grooming: Wash/dry face;Contact guard assist;Standing   Upper Body Bathing: Sitting;Set up   Lower Body Bathing: Sitting/lateral leans;Minimal assistance   Upper Body Dressing : Sitting;Set up   Lower Body Dressing: Minimal assistance;Sit to/from stand   Toilet Transfer: Minimal assistance;Rolling walker (2 wheels)   Toileting- Clothing Manipulation and Hygiene: Minimal assistance;Sit to/from stand       Functional mobility during ADLs: Minimal assistance;Rolling walker (2 wheels) General ADL Comments: max A to don TLSO brace, 2 mild LOB     Vision   Vision Assessment?: No apparent visual deficits     Perception  Perception: Within Functional Limits       Praxis Praxis: WFL       Pertinent Vitals/Pain Pain Assessment Pain Assessment: Faces Faces Pain Scale: Hurts a little bit Pain Location: back Pain Descriptors  / Indicators: Discomfort Pain Intervention(s): Monitored during session     Extremity/Trunk Assessment Upper Extremity Assessment Upper Extremity Assessment: Overall WFL for tasks assessed;LUE deficits/detail LUE Deficits / Details: mild LUE incoordination LUE Sensation: WNL LUE Coordination: decreased fine motor   Lower Extremity Assessment Lower Extremity Assessment: Overall WFL for tasks assessed   Cervical / Trunk Assessment Cervical / Trunk Assessment: Other exceptions Cervical / Trunk Exceptions: L1 compression fx wears TLSO   Communication Communication Communication: No apparent difficulties   Cognition Arousal: Alert Behavior During Therapy: WFL for tasks assessed/performed Cognition: History of cognitive impairments       Memory impairment (select all impairments): Working [employer] fast streamer, Short-term memory                       Following commands: Advertising copywriter Comments   Cueing Techniques: Verbal cues;Gestural cues;Tactile cues  HR up to 133 bpm with activity, caregiver and daughter in law in room   Exercises     Shoulder Instructions      Home Living Family/patient expects to be discharged to:: Assisted living [facility])   Available Help at Discharge: Family Type of Home: Independent living facility                       Home Equipment: Agricultural consultant (2 wheels)   Additional Comments: Pt daughter provided PLOF.  Lives With: Spouse    Prior Functioning/Environment Prior Level of Function : Needs assist;Patient poor historian/Family not available             Mobility Comments: mod I amb with RW. ADLs Comments: ind    OT Problem List: Impaired balance (sitting and/or standing);Pain;Decreased cognition;Decreased coordination;Decreased knowledge of precautions   OT Treatment/Interventions: Self-care/ADL training;Therapeutic exercise;Patient/family education;Balance training;Therapeutic activities;DME and/or AE  instruction      OT Goals(Current goals can be found in the care plan section)   Acute Rehab OT Goals Patient Stated Goal: To go back to [facility], rehab section OT Goal Formulation: With patient/family Time For Goal Achievement: 06/12/24 Potential to Achieve Goals: Good ADL Goals Pt Will Perform Grooming: with supervision;standing Pt Will Perform Lower Body Dressing: with supervision;sit to/from stand Pt Will Transfer to Toilet: with supervision;ambulating Pt Will Perform Toileting - Clothing Manipulation and hygiene: with supervision;sit to/from stand   OT Frequency:  Min 2X/week    Co-evaluation              AM-PAC OT 6 Clicks Daily Activity     Outcome Measure Help from another person eating meals?: None Help from another person taking care of personal grooming?: A Little Help from another person toileting, which includes using toliet, bedpan, or urinal?: A Little Help from another person bathing (including washing, rinsing, drying)?: A Little Help from another person to put on and taking off regular upper body clothing?: A Little Help from another person to put on and taking off regular lower body clothing?: A Little 6 Click Score: 19   End of Session Equipment Utilized During Treatment: Gait belt;Rolling walker (2 wheels) Nurse Communication: Mobility status  Activity Tolerance: Patient tolerated treatment well Patient left: in chair;with call bell/phone within reach;with chair alarm set;with family/visitor present  OT Visit Diagnosis:  Unsteadiness on feet (R26.81);Other abnormalities of gait and mobility (R26.89);History of falling (Z91.81);Other symptoms and signs involving cognitive function;Pain Pain - part of body:  (back)                Time: 8597-8566 OT Time Calculation (min): 31 min Charges:  OT General Charges $OT Visit: 1 Visit OT Evaluation $OT Eval Moderate Complexity: 1 Mod OT Treatments $Self Care/Home Management : 8-22  mins  05/29/2024  AB, OTR/L  Acute Rehabilitation Services  Office: [phone]   Curtistine JONETTA Das 05/29/2024, 3:41 PM

## 2024-05-29 NOTE — TOC Initial Note (Addendum)
 Transition of Care [facility]) - Initial/Assessment Note    Patient Details  Name: Katherine Porter MRN: [id number] Date of Birth: 11-04-1935  Transition of Care [facility]) CM/SW Contact:    Gwenn Julien Norris, LCSW Phone Number: 05/29/2024, 3:19 PM  Clinical Narrative: Message received from [facility] requesting clarification re dc plan. Spoke to pt's dtr Rosina [phone] who reports pt has an ILF apartment at [facility] but was admitted from their SNF. Spoke to Bellaire at [facility] who confirmed pt is able to return but will need new auth for continued PT/OT at [facility]. If shara is denied, pt able to return to SNF under grace days or private pay. Dtr updated and agreeable to dc plan. MD and RN aware of barrier to dc.   Home and Community/[employer] auth request submitted, reference [id number].  Julien Gwenn, MSW, LCSW [phone] (coverage)       Barriers to Discharge: Insurance Authorization   Patient Goals and CMS Choice            Expected Discharge Plan and Services         Expected Discharge Date: 05/29/24                                    Prior Living Arrangements/Services     Patient language and need for interpreter reviewed:: Yes        Need for Family Participation in Patient Care: Yes (Comment) Care giver support system in place?: Yes (comment)   Criminal Activity/Legal Involvement Pertinent to Current Situation/Hospitalization: No - Comment as needed  Activities of Daily Living   ADL Screening (condition at time of admission) Independently performs ADLs?: Yes (appropriate for developmental age) Is the patient deaf or have difficulty hearing?: No Does the patient have difficulty seeing, even when wearing glasses/contacts?: Yes Does the patient have difficulty concentrating, remembering, or making decisions?: Yes  Permission Sought/Granted Permission sought to share information with : Facility Industrial/product designer granted to share information with :  Yes, Verbal Permission Granted              Emotional Assessment       Orientation: : Oriented to Self, Oriented to Situation, Fluctuating Orientation (Suspected and/or reported Sundowners) Alcohol / Substance Use: Not Applicable Psych Involvement: No (comment)  Admission diagnosis:  TIA (transient ischemic attack) [G45.9] Dysarthria [R47.1] Patient Active Problem List   Diagnosis Date Noted   TIA (transient ischemic attack) 05/27/2024   Subdural hematoma (HCC) 05/27/2024   Acute midline low back pain without sciatica 05/24/2024   Abnormality of gait and mobility 11/27/2023   Essential hypertension 04/05/2019   Paroxysmal A-fib (HCC) 08/09/2016   History of DVT of lower extremity 08/08/2016   Asthma with COPD (chronic obstructive pulmonary disease) (HCC) 10/04/2014   Lumbar radiculopathy 07/07/2014   Esophageal reflux 07/07/2014   Hyperlipidemia 07/07/2014   PCP:  Deane Camie HERO., MD Pharmacy:   [facility] - [place], [place] - [address] [place] [address] Phone: [phone] Fax: [phone]  [facility] - [place], [place] - [address] [place] [address] Phone: [phone] Fax: [phone]     Social Drivers of Health (SDOH) Social History: SDOH Screenings   Food Insecurity: No Food Insecurity (05/28/2024)  Housing: Low Risk  (05/28/2024)  Transportation Needs: No Transportation Needs (05/28/2024)  Utilities: Not At Risk (05/28/2024)  Social Connections: Moderately Integrated (05/28/2024)  Tobacco Use: Medium Risk (05/27/2024)   SDOH Interventions:     Readmission Risk Interventions     No data to display

## 2024-05-29 NOTE — Discharge Summary (Signed)
 Physician Discharge Summary  Patient ID: Katherine Porter MRN: [id number] DOB/AGE: Jul 17, 1935 89 y.o.  Admit date: 05/27/2024 Discharge date: 05/31/2024  Admission Diagnoses:  Discharge Diagnoses:  Principal Problem:   TIA (transient ischemic attack) Active Problems:   Abnormality of gait and mobility   Acute midline low back pain without sciatica   Asthma with COPD (chronic obstructive pulmonary disease) (HCC)   Lumbar radiculopathy   Esophageal reflux   Essential hypertension   History of DVT of lower extremity   Hyperlipidemia   Paroxysmal A-fib (HCC)   Subdural hematoma (HCC)   Discharged Condition: stable  Hospital Course:  Patient is an 89 year old female with past medical history significant for atrial fibrillation on Xarelto  20 Mg p.o. once daily, history of DVT, meningioma and previous CVA.  Patient was seen alongside patient's husband and care.  Most of the history came from the patient's husband.  Apparently, patient fell about 2 weeks ago.  Patient was said to have seen Dr. Rosine Lucas Roll, neurosurgery team based out of [facility].  Patient was said to have developed dysarthria, confusion and back pain.  Patient presented as code stroke, however, MRI of the brain was negative for acute CVA.  Neurology team has signed off.  MRI brain and CT brain revealed subarachnoid hemorrhage left cerebral convexity that has increased slightly from 7 mm to 9 mm.  MRI of the lumbar spine revealed acute L1 fracture.  Neurosurgery team was consulted to assist with patient's management.  Xarelto  will be on hold.  Repeat CT head in 2 weeks.  No procedure planned by neurosurgery team.  Neurosurgery team has cleared patient for discharge.  TLSO for L1 compression fracture.  Patient's son, [name], prefers patient to follow-up with Dr. Roll, and neurosurgeon based out of [place] ([facility]).    Addendum to discharge summary done on 05/31/2024: Patient has remained stable for discharge.   Repeat CT head on very early today remains stable.  The result of CT head, left elbow, knee x-ray after the fall today is still pending.  I have discussed personally with the radiology team.  Patient will be discharged to skilled nursing facility.  Continue to hold Xarelto .  Follow-up with primary care provider and neurosurgery team within 1 to 2 weeks of discharge.   Subdural hematoma: - CT head done earlier today revealed slightly increased size (from 7 mm to 9 mm). - Patient was on Xarelto  prior to presentation, but will be held. - Patient had a fall about 2 weeks ago. - Xarelto  is currently on hold. - Neurosurgery team directed care.  No procedure planned.  Continue to hold Xarelto .  Repeat CT head in 2 weeks.  Follow-up with Dr. Roll, neurosurgeon, [facility] [place].   -Neurosurgery team is cleared patient for discharge.  Acute L1 fracture: - TLSO. - PT OT on discharge.   - Optimize pain control.   Atrial fibrillation with rapid ventricular response: - Xarelto  is on hold. - Controlled heart rate. - Optimize pain control.   Hypertension: - Continue to optimize.   Asthma/COPD: - Stable.  Consults: neurology and neurosurgery  Significant Diagnostic Studies:  CT head without contrast revealed: CT HEAD WITHOUT CONTRAST   TECHNIQUE: Contiguous axial images were obtained from the base of the skull through the vertex without intravenous contrast.   RADIATION DOSE REDUCTION: This exam was performed according to the departmental dose-optimization program which includes automated exposure control, adjustment of the mA and/or kV according to patient size and/or use of  iterative reconstruction technique.   COMPARISON:  CT head 05/28/2024.   FINDINGS: Brain: Unchanged 9 mm thick left cerebral convexity subdural hematoma. Unchanged mass effect. No evidence of acute large vascular territory infarct, significant midline shift or hydrocephalus. Remote lacunar infarct in the  right thalamus. Unchanged approximately 1 cm meningioma along the right petrous ridge.   Vascular: No hyperdense vessel or unexpected calcification.   Skull: No acute fracture.   Sinuses/Orbits: Right maxillary sinus mucosal thickening. No acute orbital findings.   Other: No mastoid effusions.   IMPRESSION: Unchanged 9 mm thick left cerebral convexity subdural hematoma. Unchanged mass effect.     Electronically Signed   By: Gilmore GORMAN Molt M.D.   On: 05/28/2024 19:28   MRI HEAD WITHOUT CONTRAST   TECHNIQUE: Multiplanar, multiecho pulse sequences of the brain and surrounding structures were obtained without intravenous contrast.   COMPARISON:  Same day CT head.   FINDINGS: Brain: When comparing across modalities, similar size of an 8 mm thick left cerebral convexity subdural hematoma with suspected subjacent small volume of subarachnoid hemorrhage. Similar mass effect. No evidence of acute infarct, mass lesion or hydrocephalus. Approximately 1 cm extra-axial dural-based mass along the petrous right temporal bone, compatible with meningioma without mass effect.   Vascular: Major arterial flow voids are maintained at the skull base.   Skull and upper cervical spine: Normal marrow signal.   Sinuses/Orbits: Right maxillary sinus partial opacification with air-fluid level.   IMPRESSION: 1. When comparing across modalities, similar size of an 8 mm thick left cerebral convexity subdural hematoma with suspected subjacent small volume of subarachnoid hemorrhage. Similar mass effect. 2. No evidence of acute infarct. 3. Approximately 1 cm meningioma along the right petrous bone without mass effect. 4. Right maxillary sinus partial opacification and air-fluid level.     Electronically Signed   By: Gilmore GORMAN Molt M.D.   On: 05/27/2024 22:17    MRI LUMBAR SPINE WITHOUT AND WITH CONTRAST   TECHNIQUE: Multiplanar and multiecho pulse sequences of the lumbar spine  were obtained without and with intravenous contrast.   CONTRAST:  8mL GADAVIST  GADOBUTROL  1 MMOL/ML IV SOLN   COMPARISON:  MRI lumbar spine 07/23/2023.   FINDINGS: Segmentation: Standard.   Alignment:  Grade 1 anterolisthesis of L4 on L5, similar.   Vertebrae: Acute/recent L1 fracture with associated marrow edema and approximately 45% height loss. No significant bony retropulsion. L4-L5 PLIF.   Conus medullaris and cauda equina: Conus extends to the T12-L1 level. Conus and cauda equina appear normal.   Paraspinal and other soft tissues: Mild paraspinal edema surrounding L1 fracture.   Disc levels:   T12-L1: No significant disc protrusion, foraminal stenosis, or canal stenosis.   L1-L2: No significant disc protrusion, foraminal stenosis, or canal stenosis.   L2-L3: No significant disc protrusion, foraminal stenosis, or canal stenosis.   L3-L4: Disc bulging ligamentum flavum thickening. Progressive mild canal stenosis. Similar moderate right and mild left foraminal stenosis.   L4-L5: PLIF.  Patent canal and foramina.   L5-S1: Patent canal and foramina.   IMPRESSION: 1. Acute/recent L1 compression fracture with 45% height loss. No significant bony retropulsion. 2. At L3-L4, progressive mild canal stenosis. Similar moderate right and mild left foraminal stenosis. 3. L4-L5 PLIF.     Electronically Signed   By: Gilmore GORMAN Molt M.D.   On: 05/28/2024 02:15      Ionized calcium  was 1.12.     Treatments: Xarelto  will be on hold.  Repeat CT head in 2 weeks.  Neurosurgery team  directed care.  Patient will follow-up with the neurosurgery team based out of [facility] [place] (Dr. Jacklyn).  TLSO for L1 compression fracture.  Discharge Exam: Blood pressure 106/71, pulse 100, temperature 98.2 F (36.8 C), resp. rate 18, height 5' 4 (1.626 m), weight 69.8 kg, SpO2 95%.   Disposition: Discharge disposition: 06-Home-Health Care Svc.  Follow-up with neurosurgery  team within 1 to 2 weeks (Dr. Jacklyn).  Repeat CT head in 2 weeks.       Discharge Instructions     Diet - low sodium heart healthy   Complete by: As directed    Diet - low sodium heart healthy   Complete by: As directed    Increase activity slowly   Complete by: As directed    Increase activity slowly   Complete by: As directed       Allergies as of 05/31/2024       Reactions   Aspirin Other (See Comments)   Pt is unable to take due to contraindication with other meds   Naproxen Other (See Comments)   Contraindicated due to medication list   Other Other (See Comments)   bruising   Tramadol Other (See Comments), Nausea And Vomiting   Wound Dressing Adhesive Other (See Comments)   bruising   Codeine Nausea Only   Baclofen Other (See Comments)   Drowsiness  Drowsiness        Medication List     STOP taking these medications    bisacodyl 10 MG suppository Commonly known as: DULCOLAX   Calcium  600+D Plus Minerals 600-400 MG-UNIT Tabs   Fish Oil 1000 MG Caps   guaiFENesin 100 MG/5ML liquid Commonly known as: ROBITUSSIN   lidocaine  4 % Replaced by: lidocaine  5 %   methocarbamol 500 MG tablet Commonly known as: ROBAXIN   MULTIVITAMIN ADULT PO   neomycin-polymyxin b-dexamethasone 3.5-10000-0.1 Oint Commonly known as: MAXITROL   sodium chloride  0.65 % Soln nasal spray Commonly known as: OCEAN   Systane Preservative Free 0.4-0.3 % Soln Generic drug: Polyethyl Glyc-Propyl Glyc PF   Xarelto  20 MG Tabs tablet Generic drug: rivaroxaban        TAKE these medications    acetaminophen  500 MG tablet Commonly known as: TYLENOL  Take 1,000 mg by mouth in the morning, at noon, and at bedtime.   atorvastatin 40 MG tablet Commonly known as: LIPITOR Take 40 mg by mouth at bedtime.   calcitonin (salmon) 200 UNIT/ACT nasal spray Commonly known as: MIACALCIN/FORTICAL Place 1 spray into alternate nostrils daily.   calcium  carbonate 500 MG chewable  tablet Commonly known as: Tums Chew 1 tablet (200 mg of elemental calcium  total) by mouth daily before supper. What changed: when to take this   Cyanocobalamin 1000 MCG Subl Take 1 tablet by mouth daily.   diltiazem  120 MG 24 hr capsule Commonly known as: CARDIZEM  CD Take 120 mg by mouth daily.   gabapentin  600 MG tablet Commonly known as: NEURONTIN  Take 600 mg by mouth at bedtime. What changed: Another medication with the same name was removed. Continue taking this medication, and follow the directions you see here.   lidocaine  5 % Commonly known as: LIDODERM  Place 1 patch onto the skin daily. Remove & Discard patch within 12 hours or as directed by MD Start taking on: June 01, 2024 Replaces: lidocaine  4 %   oxyCODONE  5 MG immediate release tablet Commonly known as: Oxy IR/ROXICODONE  Take 1 tablet (5 mg total) by mouth every 6 (six) hours as needed for severe  pain (pain score 7-10) or moderate pain (pain score 4-6) (acute surgical pain of the pelvis). What changed: Another medication with the same name was removed. Continue taking this medication, and follow the directions you see here.   polyethylene glycol 17 g packet Commonly known as: MIRALAX / GLYCOLAX Take 17 g by mouth daily. Take 17g by mouth once daily.  May take one additional dose as needed for constipation.   Restasis  0.05 % ophthalmic emulsion Generic drug: cycloSPORINE  Place 1 drop into both eyes 2 (two) times daily.   senna-docusate 8.6-50 MG tablet Commonly known as: Senokot-S Take 1 tablet by mouth at bedtime as needed for up to 15 days for moderate constipation. What changed:  when to take this reasons to take this        Contact information for after-discharge care     Destination     [facility] .   Service: Skilled Nursing Contact information: [address] [place]  [address] [phone]                    Time spent: 35 minutes.  SignedBETHA Leatrice LILLETTE Rosario 05/31/2024, 2:38 PM

## 2024-05-29 NOTE — Progress Notes (Signed)
 PROGRESS NOTE    Katherine Porter  FMW:[id number] DOB: [birth year] DOA: 05/27/2024 PCP: Deane Camie HERO., MD  Outpatient Specialists:     Brief Narrative:  Patient is an 89 year old female with past medical history significant for atrial fibrillation on Xarelto  20 Mg p.o. once daily, history of DVT, meningioma and previous CVA.  Patient was seen alongside patient's husband and care.  Most of the history came from the patient's husband.  Apparently, patient fell about 2 weeks ago.  Patient was said to have seen Dr. Rosine Lucas Roll, neurosurgery team based out of [facility].  Patient was said to have developed dysarthria, confusion and back pain.  Patient presented as code stroke, however, MRI of the brain was negative for acute CVA.  Neurology team has signed off.  MRI brain and CT brain revealed subarachnoid hemorrhage left cerebral convexity that has increased slightly from 7 mm to 9 mm.  MRI of the lumbar spine revealed acute L1 fracture.  Above findings have been discussed with the neurosurgery team on-call, Dr. Gillie.  Neurosurgery team will see patient and advise further care.  Xarelto  is on hold.  05/29/2024: Patient seen alongside patient's daughter-in-law.  Neurosurgery input is appreciated.  No neurosurgical procedure planned.  Neurosurgery team has advised repeating CT head in 2 weeks.  TLSO to acute L1 compression fracture.  Pursue disposition.     Assessment & Plan:   Principal Problem:   TIA (transient ischemic attack) Active Problems:   Abnormality of gait and mobility   Acute midline low back pain without sciatica   Asthma with COPD (chronic obstructive pulmonary disease) (HCC)   Lumbar radiculopathy   Esophageal reflux   Essential hypertension   History of DVT of lower extremity   Hyperlipidemia   Paroxysmal A-fib (HCC)   Subdural hematoma (HCC)   Subdural hematoma: - CT head done earlier today revealed slightly increased size (from 7 mm to 9 mm). - Patient was on  Xarelto  prior to presentation. - Patient had a fall about 2 weeks ago. - Xarelto  is currently on hold. - Neurosurgery team has been consulted (see above documentation).  Acute L1 fracture: - TLSO - Optimize pain control.  Atrial fibrillation with rapid ventricular response: - Xarelto  is on hold. - Controlled heart rate. - Optimize pain control. - Last documented heart rate was 87 bpm.  Heart rate has ranged from 65 to 142 bpm.  Hypertension: - Continue to optimize.  Asthma/COPD: - Stable.  DVT prophylaxis: SCD. Code Status: Full code Family Communication: Husband. Disposition Plan: This will depend on hospital course   Consultants:  Neurosurgery (Dr. Debby and Dr. Gillie)  Procedures:  None.  Antimicrobials:  None.   Subjective: No significant history from patient.  Objective: Vitals:   05/28/24 2332 05/29/24 0355 05/29/24 0802 05/29/24 1158  BP: 112/77 113/86 (!) 137/91 132/88  Pulse: (!) 101 93 98 98  Resp: 18 17 19 19   Temp: (!) 97.5 F (36.4 C) 98.2 F (36.8 C) (!) 97.5 F (36.4 C) 97.6 F (36.4 C)  TempSrc:  Oral Oral Oral  SpO2: 94% 95% 97% 97%  Weight:      Height:        Intake/Output Summary (Last 24 hours) at 05/29/2024 1518 Last data filed at 05/29/2024 0502 Gross per 24 hour  Intake 817.33 ml  Output 1200 ml  Net -382.67 ml   Filed Weights   05/27/24 1857 05/27/24 2325 05/28/24 0055  Weight: 73.8 kg 74 kg 69.8 kg    Examination:  General exam: Appears calm and comfortable. Respiratory system: Clear to auscultation.  Cardiovascular system: S1 & S2, irregularly irregular.   Gastrointestinal system: Abdomen is soft and nontender. Central nervous system: Awake and alert.     Data Reviewed: I have personally reviewed following labs and imaging studies  CBC: Recent Labs  Lab 05/27/24 1856 05/27/24 1857  WBC 11.8*  --   NEUTROABS 7.3  --   HGB 13.6 14.3  HCT 42.8 42.0  MCV 96.0  --   PLT 264  --    Basic Metabolic  Panel: Recent Labs  Lab 05/27/24 1856 05/27/24 1857  NA 132* 135  K 4.0 3.9  CL 102 105  CO2 19*  --   GLUCOSE 115* 116*  BUN 14 14  CREATININE 0.78 0.70  CALCIUM  9.2  --    GFR: Estimated Creatinine Clearance: 47.5 mL/min (by C-G formula based on SCr of 0.7 mg/dL). Liver Function Tests: Recent Labs  Lab 05/27/24 1856  AST 19  ALT 31  ALKPHOS 201*  BILITOT 0.7  PROT 6.8  ALBUMIN 3.4*   No results for input(s): LIPASE, AMYLASE in the last 168 hours. No results for input(s): AMMONIA in the last 168 hours. Coagulation Profile: Recent Labs  Lab 05/27/24 1856  INR 2.3*   Cardiac Enzymes: No results for input(s): CKTOTAL, CKMB, CKMBINDEX, TROPONINI in the last 168 hours. BNP (last 3 results) No results for input(s): PROBNP in the last 8760 hours. HbA1C: Recent Labs    05/28/24 0255  HGBA1C 5.7*   CBG: Recent Labs  Lab 05/27/24 1850  GLUCAP 113*   Lipid Profile: Recent Labs    05/28/24 0255  CHOL 140  HDL 60  LDLCALC 69  TRIG 55  CHOLHDL 2.3   Thyroid Function Tests: No results for input(s): TSH, T4TOTAL, FREET4, T3FREE, THYROIDAB in the last 72 hours. Anemia Panel: No results for input(s): VITAMINB12, FOLATE, FERRITIN, TIBC, IRON, RETICCTPCT in the last 72 hours. Urine analysis: No results found for: COLORURINE, APPEARANCEUR, LABSPEC, PHURINE, GLUCOSEU, HGBUR, BILIRUBINUR, KETONESUR, PROTEINUR, UROBILINOGEN, NITRITE, LEUKOCYTESUR Sepsis Labs: @LABRCNTIP (procalcitonin:4,lacticidven:4)  )No results found for this or any previous visit (from the past 240 hours).       Radiology Studies: CT HEAD WO CONTRAST ( ) Result Date: 05/28/2024 CLINICAL DATA:  Subdural hematoma EXAM: CT HEAD WITHOUT CONTRAST TECHNIQUE: Contiguous axial images were obtained from the base of the skull through the vertex without intravenous contrast. RADIATION DOSE REDUCTION: This exam was performed according to the  departmental dose-optimization program which includes automated exposure control, adjustment of the mA and/or kV according to patient size and/or use of iterative reconstruction technique. COMPARISON:  CT head 05/28/2024. FINDINGS: Brain: Unchanged 9 mm thick left cerebral convexity subdural hematoma. Unchanged mass effect. No evidence of acute large vascular territory infarct, significant midline shift or hydrocephalus. Remote lacunar infarct in the right thalamus. Unchanged approximately 1 cm meningioma along the right petrous ridge. Vascular: No hyperdense vessel or unexpected calcification. Skull: No acute fracture. Sinuses/Orbits: Right maxillary sinus mucosal thickening. No acute orbital findings. Other: No mastoid effusions. IMPRESSION: Unchanged 9 mm thick left cerebral convexity subdural hematoma. Unchanged mass effect. Electronically Signed   By: Gilmore GORMAN Molt M.D.   On: 05/28/2024 19:28   CT HEAD WO CONTRAST ( ) Result Date: 05/28/2024 CLINICAL DATA:  Subdural hematoma EXAM: CT HEAD WITHOUT CONTRAST TECHNIQUE: Contiguous axial images were obtained from the base of the skull through the vertex without intravenous contrast. RADIATION DOSE REDUCTION: This exam was performed according to the departmental  dose-optimization program which includes automated exposure control, adjustment of the mA and/or kV according to patient size and/or use of iterative reconstruction technique. COMPARISON:  CT head 05/27/2024 and MRI head 05/27/2024. FINDINGS: Brain: Redemonstrated subdural hematoma over the left cerebral convexity which appears slightly increased in size now measuring up to 9 mm in thickness, previously 7 mm. Mild associated mass effect. No midline shift. No additional areas of hemorrhage identified. Nonspecific hypoattenuation in the periventricular and subcortical white matter favored to reflect chronic microvascular ischemic changes. Remote lacunar infarct in the right thalamus. Mild parenchymal  volume loss. Similar meningioma along the right petrous ridge. Ventricles: The ventricles are normal. Vascular: Atherosclerotic calcifications of the carotid siphons. No hyperdense vessel. Skull: No acute or aggressive finding. Orbits: Bilateral lens replacement. Sinuses: Mucosal thickening in the right maxillary sinus. Other: Mastoid air cells are clear. IMPRESSION: Left cerebral convexity subdural hematoma is slightly increased now measuring up to 9 mm, previously 7 mm. Mild local mass effect. No midline shift. Recommend short interval follow-up head CT. Chronic microvascular ischemic changes. Remote lacunar infarct in the right thalamus. Similar meningioma along the right petrous ridge. Electronically Signed   By: Donnice Mania M.D.   On: 05/28/2024 10:52   EEG adult Result Date: 05/28/2024 Shelton Arlin KIDD, MD     05/28/2024  4:28 AM Patient Name: Gearldean Lomanto MRN: [id number] Epilepsy Attending: Arlin KIDD Shelton Referring Physician/Provider: Voncile Isles, MD Date: 05/28/2024 Duration: 23.13 mins Patient history: 89 y.o. female past history of paroxysmal atrial fibrillation on Xarelto  presenting for evaluation of sudden onset of slurred speech. EEG to evaluate for seizure. Level of alertness: Awake, drowsy AEDs during EEG study: Ativan  Technical aspects: This EEG study was done with scalp electrodes positioned according to the 10-20 International system of electrode placement. Electrical activity was reviewed with band pass filter of 1-70Hz , sensitivity of 7 uV/mm, display speed of 8mm/sec with a 60Hz  notched filter applied as appropriate. EEG data were recorded continuously and digitally stored.  Video monitoring was available and reviewed as appropriate. Description: The posterior dominant rhythm consists of 9 Hz activity of moderate voltage (25-35 uV) seen predominantly in posterior head regions, symmetric and reactive to eye opening and eye closing. Drowsiness was characterized by attenuation of the  posterior background rhythm. EEG showed intermittent generalized 3 to 6 Hz theta-delta slowing. Hyperventilation and photic stimulation were not performed.   ABNORMALITY - Intermittent slow, generalized IMPRESSION: This study is suggestive of mild diffuse encephalopathy. No seizures or epileptiform discharges were seen throughout the recording. Arlin KIDD Shelton   MR Lumbar Spine W Wo Contrast Result Date: 05/28/2024 CLINICAL DATA:  Lumbar radiculopathy, symptoms persist with > 6 wks treatment EXAM: MRI LUMBAR SPINE WITHOUT AND WITH CONTRAST TECHNIQUE: Multiplanar and multiecho pulse sequences of the lumbar spine were obtained without and with intravenous contrast. CONTRAST:  8mL GADAVIST  GADOBUTROL  1 MMOL/ML IV SOLN COMPARISON:  MRI lumbar spine 07/23/2023. FINDINGS: Segmentation: Standard. Alignment:  Grade 1 anterolisthesis of L4 on L5, similar. Vertebrae: Acute/recent L1 fracture with associated marrow edema and approximately 45% height loss. No significant bony retropulsion. L4-L5 PLIF. Conus medullaris and cauda equina: Conus extends to the T12-L1 level. Conus and cauda equina appear normal. Paraspinal and other soft tissues: Mild paraspinal edema surrounding L1 fracture. Disc levels: T12-L1: No significant disc protrusion, foraminal stenosis, or canal stenosis. L1-L2: No significant disc protrusion, foraminal stenosis, or canal stenosis. L2-L3: No significant disc protrusion, foraminal stenosis, or canal stenosis. L3-L4: Disc bulging ligamentum flavum thickening. Progressive mild canal  stenosis. Similar moderate right and mild left foraminal stenosis. L4-L5: PLIF.  Patent canal and foramina. L5-S1: Patent canal and foramina. IMPRESSION: 1. Acute/recent L1 compression fracture with 45% height loss. No significant bony retropulsion. 2. At L3-L4, progressive mild canal stenosis. Similar moderate right and mild left foraminal stenosis. 3. L4-L5 PLIF. Electronically Signed   By: Gilmore GORMAN Molt M.D.   On:  05/28/2024 02:15   MR BRAIN WO CONTRAST Result Date: 05/27/2024 CLINICAL DATA:  Subdural hematoma EXAM: MRI HEAD WITHOUT CONTRAST TECHNIQUE: Multiplanar, multiecho pulse sequences of the brain and surrounding structures were obtained without intravenous contrast. COMPARISON:  Same day CT head. FINDINGS: Brain: When comparing across modalities, similar size of an 8 mm thick left cerebral convexity subdural hematoma with suspected subjacent small volume of subarachnoid hemorrhage. Similar mass effect. No evidence of acute infarct, mass lesion or hydrocephalus. Approximately 1 cm extra-axial dural-based mass along the petrous right temporal bone, compatible with meningioma without mass effect. Vascular: Major arterial flow voids are maintained at the skull base. Skull and upper cervical spine: Normal marrow signal. Sinuses/Orbits: Right maxillary sinus partial opacification with air-fluid level. IMPRESSION: 1. When comparing across modalities, similar size of an 8 mm thick left cerebral convexity subdural hematoma with suspected subjacent small volume of subarachnoid hemorrhage. Similar mass effect. 2. No evidence of acute infarct. 3. Approximately 1 cm meningioma along the right petrous bone without mass effect. 4. Right maxillary sinus partial opacification and air-fluid level. Electronically Signed   By: Gilmore GORMAN Molt M.D.   On: 05/27/2024 22:17   CT ANGIO HEAD NECK W WO CM (CODE STROKE) Result Date: 05/27/2024 CLINICAL DATA:  Neuro deficit, acute, stroke suspected EXAM: CT ANGIOGRAPHY HEAD AND NECK WITH AND WITHOUT CONTRAST TECHNIQUE: Multidetector CT imaging of the head and neck was performed using the standard protocol during bolus administration of intravenous contrast. Multiplanar CT image reconstructions and MIPs were obtained to evaluate the vascular anatomy. Carotid stenosis measurements (when applicable) are obtained utilizing NASCET criteria, using the distal internal carotid diameter as the  denominator. RADIATION DOSE REDUCTION: This exam was performed according to the departmental dose-optimization program which includes automated exposure control, adjustment of the mA and/or kV according to patient size and/or use of iterative reconstruction technique. CONTRAST:  75mL OMNIPAQUE  IOHEXOL  350 MG/ML SOLN COMPARISON:  Same day CT head. FINDINGS: CTA NECK FINDINGS Aortic arch: Aortic atherosclerosis. Great vessel origins are patent. Right carotid system: No evidence of dissection, stenosis (50% or greater), or occlusion. Left carotid system: No evidence of dissection, stenosis (50% or greater), or occlusion. Vertebral arteries: Left dominant. No evidence of dissection, stenosis (50% or greater), or occlusion. Skeleton: No acute fracture. Other neck: No acute abnormality on limited assessment. Enlarged and heterogeneous thyroid, which is further evaluated on prior ultrasound from February 18, 2024. Upper chest: Visualized lung apices are clear. Review of the MIP images confirms the above findings CTA HEAD FINDINGS Anterior circulation: Bilateral intracranial ICAs, MCAs, and ACAs are patent without proximal hemodynamically significant stenosis. No aneurysm identified. Posterior circulation: Bilateral intradural vertebral arteries, basilar artery and bilateral posterior cerebral arteries are patent. Moderate left P2 PCA stenosis. No aneurysm identified. Venous sinuses: As permitted by contrast timing, patent. Review of the MIP images confirms the above findings IMPRESSION: 1. No emergent large vessel occlusion. 2. Moderate left P2 PCA stenosis. 3. Aortic Atherosclerosis (ICD10-I70.0). Electronically Signed   By: Gilmore GORMAN Molt M.D.   On: 05/27/2024 19:22   CT HEAD CODE STROKE WO CONTRAST Result Date: 05/27/2024 CLINICAL DATA:  Code stroke.  Neuro deficit, acute, stroke suspected EXAM: CT HEAD WITHOUT CONTRAST TECHNIQUE: Contiguous axial images were obtained from the base of the skull through the vertex  without intravenous contrast. RADIATION DOSE REDUCTION: This exam was performed according to the departmental dose-optimization program which includes automated exposure control, adjustment of the mA and/or kV according to patient size and/or use of iterative reconstruction technique. COMPARISON:  CT head August 21, 2019. FINDINGS: Brain: Approximately 7 mm thick mixed density left cerebral convexity subdural hematoma with areas of mild hyperdensity suggesting a component of recent hemorrhage. No substantial midline shift. No evidence of acute infarction, hydrocephalus, extra-axial collection. Remote lacunar infarct in the right thalamus. Patchy white matter hypodensities, compatible with chronic microvascular ischemic disease. Similar approximately 1 cm meningioma along the right petrous bone (series 3, image 7). Vascular: No hyperdense vessel. Skull: No acute fracture. Sinuses/Orbits: Moderate paranasal sinus mucosal thickening the right maxillary sinus. No acute orbital findings. Other: No mastoid effusions. ASPECTS [facility] Stroke Program Early CT Score) Total score (0-10 with 10 being normal): 10. IMPRESSION: 1. Approximately 7 mm thick mixed density left cerebral convexity subdural hematoma with areas of mild hyperdensity suggesting a component of recent hemorrhage. No substantial midline shift. 2. No evidence of acute large vascular territory infarct. Chronic microvascular ischemic disease and remote right thalamic infarct. 3. Similar approximately 1 cm meningioma along the right petrous bone. Code stroke imaging results were communicated on 05/27/2024 at 7:16 pm to provider Dr. Voncile via telephone. Electronically Signed   By: Gilmore GORMAN Molt M.D.   On: 05/27/2024 19:17        Scheduled Meds:  calcium  carbonate  500 mg Oral QAC supper   cycloSPORINE   1 drop Both Eyes BID   diltiazem   120 mg Oral Daily   gabapentin   600 mg Oral Daily   melatonin  5 mg Oral QHS   Continuous Infusions:      LOS: 0 days    Time spent: 35 minutes.    Leatrice Chapel, MD  [facility] Pager #: [phone] 7PM-7AM contact night coverage as above

## 2024-05-29 NOTE — Evaluation (Signed)
 Speech Language Pathology Evaluation Patient Details Name: Katherine Porter MRN: [id number] DOB: [birth year] Today's Date: 05/29/2024 Time: 8688-8664 SLP Time Calculation (min) (ACUTE ONLY): 24 min  Problem List:  Patient Active Problem List   Diagnosis Date Noted   TIA (transient ischemic attack) 05/27/2024   Subdural hematoma (HCC) 05/27/2024   Acute midline low back pain without sciatica 05/24/2024   Abnormality of gait and mobility 11/27/2023   Essential hypertension 04/05/2019   Paroxysmal A-fib (HCC) 08/09/2016   History of DVT of lower extremity 08/08/2016   Asthma with COPD (chronic obstructive pulmonary disease) (HCC) 10/04/2014   Lumbar radiculopathy 07/07/2014   Esophageal reflux 07/07/2014   Hyperlipidemia 07/07/2014   Past Medical History:  Past Medical History:  Diagnosis Date   Atrial fibrillation (HCC)    DVT (deep venous thrombosis) (HCC)    Past Surgical History:  Past Surgical History:  Procedure Laterality Date   APPENDECTOMY     BREAST SURGERY     spleenectomy     HPI:  Pt is an 89 yo female presenting to [facility] on 05/27/24 with acute onset dysarthria, confusion, and severe low back pain. Workup revealed L1 compression fx and L cerebral cortex SDH. Previous SLE s/p CVA (Jan 2025) noted Pt scored 14/30 on SLUMS cognitive assessment today indicating severe cognitive-communication deficits. She also had an MBS at that time. PMH of afib, DVT, previous CVA back in January (MRI showed a small meningioma as well as possible CVA).   Assessment / Plan / Recommendation Clinical Impression  Pt presents with cognitive communication deficits, daughter in law reports that pt may be at or close to baseline function. She has noticed a decline in pt's cognitive abilities overtime and reports that PTA she was disoriented to time and had some decreased short term recall. Daughter in law reports that she lives in an independent living facility with her husband and is active in clubs  and enjoys walking for exercise. Pt was asleep upon arrival and aroused with verbal greeting, however remained lethargic, eyes cloed, through majority of eval. Portions of [facility] Mental Status Examination (SLUMS) administered to assess cogntition. She is oriented x2 with difficulties recalling full date and current city. Immediate recall of listed items was 5/5 and she recalled 3/5 items after short delay. During mathematical calculation task, pt perseverated on previous immediate recall task despite verbal redirections. She scored 1/3 on fluency/divergent naming task. Daughter in law reports that pt appears to have had an acute decrease in short term recall and agreed to f/u from SLP services acutely and at next venue of care in order to further assess and treat cognitive comunication deficits, in order to maximize independence and safety.    SLP Assessment  SLP Recommendation/Assessment: Patient needs continued Speech Language Pathology Services SLP Visit Diagnosis: Cognitive communication deficit (R41.841)     Assistance Recommended at Discharge     Functional Status Assessment Patient has had a recent decline in their functional status and demonstrates the ability to make significant improvements in function in a reasonable and predictable amount of time.  Frequency and Duration min 2x/week  2 weeks      SLP Evaluation Cognition  Overall Cognitive Status: History of cognitive impairments - at baseline Arousal/Alertness: Lethargic Orientation Level: Oriented to person;Disoriented to place;Disoriented to time;Oriented to situation Year: 2024 Month: May Day of Week: Incorrect Attention: Sustained Sustained Attention: Impaired Sustained Attention Impairment: Verbal basic Memory: Impaired Memory Impairment: Storage deficit;Retrieval deficit;Decreased recall of new information Awareness: Impaired Awareness Impairment:  Intellectual impairment Problem Solving:  Impaired Problem Solving Impairment: Verbal basic       Comprehension  Auditory Comprehension Overall Auditory Comprehension: Appears within functional limits for tasks assessed Visual Recognition/Discrimination Discrimination: Not tested Reading Comprehension Reading Status: Not tested    Expression Expression Primary Mode of Expression: Verbal Verbal Expression Overall Verbal Expression: Appears within functional limits for tasks assessed Written Expression Written Expression: Not tested   Oral / Motor  Oral Motor/Sensory Function Overall Oral Motor/Sensory Function: Within functional limits Motor Speech Overall Motor Speech: Appears within functional limits for tasks assessed Respiration: Within functional limits Phonation: Normal Resonance: Within functional limits Articulation: Within functional limitis Intelligibility: Intelligible             Katherine Kin, MA, CCC-SLP Acute Rehabilitation Services Office Number: [phone]  Katherine Porter 05/29/2024, 2:05 PM

## 2024-05-29 NOTE — Evaluation (Addendum)
 Addendum: Per LCSW follow up with pts daughter, pt was at skilled rehab at [facility] prior to hospitalization. PT is in agreement that return to [facility] SNF is appropriate. Katherine Porter B. Fleeta Lapidus PT, DPT Acute Rehabilitation Services Please use secure chat or  Call Office [phone]    Physical Therapy Evaluation Patient Details Name: Katherine Porter MRN: [id number] DOB: Mar 17, 1935 Today's Date: 05/29/2024  History of Present Illness  Pt is an 89 yo female presenting to [facility] on 05/27/24 with acute onset dysarthria, confusion, and severe low back pain. Workup revealed L1 compression fx and L cerebral cortex SDH. PMH of afib, DVT.  Clinical Impression  Pt poor historian, oriented to self. Caregiver in room but it is her first day with pt and is unsure of baseline level of function. Caregiver is able to report pt lives at [facility] with her husband and has daily caregivers. Pt has been wearing TLSO, and is in room at time of Evaluation. Pt limited in safe mobility, by decreased cognition, back pain, generalized weakness and decreased balance. Pt is familiar with log rolling technique for decreased back pain and is able to roll onto side independently. Pt requires min A for coming to upright, transfer to standing in RW and for ambulation around bed to recliner on other side. PT recommending return to [facility] with HHPT. PT will continue to follow acutely.         If plan is discharge home, recommend the following: A little help with walking and/or transfers;A little help with bathing/dressing/bathroom;Assistance with cooking/housework;Direct supervision/assist for medications management;Direct supervision/assist for financial management;Assist for transportation;Help with stairs or ramp for entrance;Supervision due to cognitive status   Can travel by private vehicle    Yes    Equipment Recommendations None recommended by PT     Functional Status Assessment Patient has had a recent decline  in their functional status and demonstrates the ability to make significant improvements in function in a reasonable and predictable amount of time.     Precautions / Restrictions Precautions Precautions: Fall Recall of Precautions/Restrictions: Impaired Precaution/Restrictions Comments: pt with L-1 compression fx Required Braces or Orthoses: Spinal Brace Spinal Brace: Thoracolumbosacral orthotic;Applied in sitting position (for OOB)      Mobility  Bed Mobility Overal bed mobility: Needs Assistance Bed Mobility: Rolling, Sidelying to Sit Rolling: Independent Sidelying to sit: Min assist       General bed mobility comments: pt reports remembering logrolling technique and is able to roll onto her side independently and move LE off bed, min A required to come to upright and scoot hips to EoB    Transfers Overall transfer level: Needs assistance Equipment used: Rolling walker (2 wheels) Transfers: Sit to/from Stand Sit to Stand: Min assist           General transfer comment: good power up to walker, min A for steadying in standing    Ambulation/Gait Ambulation/Gait assistance: Min assist Gait Distance (Feet): 15 Feet Assistive device: Rolling walker (2 wheels) Gait Pattern/deviations: Step-through pattern, Decreased step length - right, Decreased step length - left, Trunk flexed, Shuffle Gait velocity: slowed Gait velocity interpretation: <1.31 ft/sec, indicative of household ambulator   General Gait Details: min A for steadying and RW managment around foot of bed, vc for upright posture and proximity to RW   Modified Rankin (Stroke Patients Only) Modified Rankin (Stroke Patients Only) Pre-Morbid Rankin Score: No significant disability Modified Rankin: No significant disability     Balance Overall balance assessment: Needs assistance Sitting-balance support: Feet  supported, No upper extremity supported Sitting balance-Leahy Scale: Good     Standing balance  support: During functional activity, Reliant on assistive device for balance Standing balance-Leahy Scale: Fair Standing balance comment: needs outside assist                             Pertinent Vitals/Pain Pain Assessment Pain Assessment: Faces Faces Pain Scale: Hurts little more Breathing: normal Negative Vocalization: none Facial Expression: smiling or inexpressive Body Language: relaxed Consolability: no need to console PAINAD Score: 0 Pain Location: back with bed mobility Pain Descriptors / Indicators: Grimacing, Guarding, Discomfort Pain Intervention(s): Limited activity within patient's tolerance, Monitored during session, Repositioned    Home Living Family/patient expects to be discharged to:: Assisted living (caregiver [place] reports she is from [facility])                 Home Equipment: Agricultural consultant (2 wheels) Additional Comments: pt is poor historian and it is caregivers first day with her so she is not able to provide background    Prior Function Prior Level of Function : Needs assist;Patient poor historian/Family not available                     Extremity/Trunk Assessment   Upper Extremity Assessment Upper Extremity Assessment: Defer to OT evaluation    Lower Extremity Assessment Lower Extremity Assessment: Overall WFL for tasks assessed    Cervical / Trunk Assessment Cervical / Trunk Assessment: Other exceptions Cervical / Trunk Exceptions: L1 compression fx wears TLSO  Communication   Communication Communication: No apparent difficulties    Cognition Arousal: Alert Behavior During Therapy: WFL for tasks assessed/performed   PT - Cognitive impairments: No family/caregiver present to determine baseline, Orientation   Orientation impairments: Place, Time, Situation                   PT - Cognition Comments: pt caregiver present but it is her first day with the patient so she is unsure of her prior level of  cognition Following commands: Intact       Cueing Cueing Techniques: Verbal cues, Gestural cues, Tactile cues     General Comments General comments (skin integrity, edema, etc.): VSS on RA, caregiver in room throughout.        Assessment/Plan    PT Assessment Patient needs continued PT services  PT Problem List Decreased balance;Decreased mobility;Decreased coordination;Decreased cognition;Decreased knowledge of use of DME;Decreased safety awareness       PT Treatment Interventions DME instruction;Gait training;Functional mobility training;Therapeutic activities;Therapeutic exercise;Balance training;Neuromuscular re-education;Cognitive remediation;Patient/family education    PT Goals (Current goals can be found in the Care Plan section)  Acute Rehab PT Goals PT Goal Formulation: Patient unable to participate in goal setting Time For Goal Achievement: 06/12/24 Potential to Achieve Goals: Good    Frequency Min 2X/week        AM-PAC PT 6 Clicks Mobility  Outcome Measure Help needed turning from your back to your side while in a flat bed without using bedrails?: None Help needed moving from lying on your back to sitting on the side of a flat bed without using bedrails?: A Little Help needed moving to and from a bed to a chair (including a wheelchair)?: A Little Help needed standing up from a chair using your arms (e.g., wheelchair or bedside chair)?: A Little Help needed to walk in hospital room?: A Little Help needed climbing 3-5 steps with a  railing? : A Lot 6 Click Score: 18    End of Session Equipment Utilized During Treatment: Back brace Activity Tolerance: Patient tolerated treatment well Patient left: in chair;with call bell/phone within reach;with chair alarm set;with family/visitor present Nurse Communication: Mobility status PT Visit Diagnosis: Unsteadiness on feet (R26.81);Other abnormalities of gait and mobility (R26.89);Muscle weakness (generalized)  (M62.81);Difficulty in walking, not elsewhere classified (R26.2);Other symptoms and signs involving the nervous system (R29.898)    Time: 9161-9092 PT Time Calculation (min) (ACUTE ONLY): 29 min   Charges:   PT Evaluation $PT Eval Moderate Complexity: 1 Mod PT Treatments $Therapeutic Activity: 8-22 mins PT General Charges $$ ACUTE PT VISIT: 1 Visit         Stylianos Stradling B. Fleeta Lapidus PT, DPT Acute Rehabilitation Services Please use secure chat or  Call Office [phone]   Katherine Porter 05/29/2024, 9:30 AM

## 2024-05-30 DIAGNOSIS — G459 Transient cerebral ischemic attack, unspecified: Secondary | ICD-10-CM | POA: Diagnosis not present

## 2024-05-30 MED ORDER — ENSURE PLUS HIGH PROTEIN PO LIQD
237.0000 mL | Freq: Two times a day (BID) | ORAL | Status: DC
  Administered 2024-05-30 – 2024-05-31 (×2): 237 mL via ORAL

## 2024-05-30 NOTE — Progress Notes (Signed)
 PROGRESS NOTE    Katherine Porter  FMW:[id number] DOB: January 23, 1935 DOA: 05/27/2024 PCP: Deane Camie HERO., MD  Outpatient Specialists:     Brief Narrative:  Patient is an 89 year old female with past medical history significant for atrial fibrillation on Xarelto  20 Mg p.o. once daily, history of DVT, meningioma and previous CVA.  Patient was seen alongside patient's husband and care.  Most of the history came from the patient's husband.  Apparently, patient fell about 2 weeks ago.  Patient was said to have seen Dr. Rosine Lucas Roll, neurosurgery team based out of [facility].  Patient was said to have developed dysarthria, confusion and back pain.  Patient presented as code stroke, however, MRI of the brain was negative for acute CVA.  Neurology team has signed off.  MRI brain and CT brain revealed subarachnoid hemorrhage left cerebral convexity that has increased slightly from 7 mm to 9 mm.  MRI of the lumbar spine revealed acute L1 fracture.  Above findings have been discussed with the neurosurgery team on-call, Dr. Gillie.  Neurosurgery team will see patient and advise further care.  Xarelto  is on hold.  05/29/2024: Patient seen alongside patient's daughter-in-law.  Neurosurgery input is appreciated.  No neurosurgical procedure planned.  Neurosurgery team has advised repeating CT head in 2 weeks.  TLSO to acute L1 compression fracture.  Pursue disposition.    05/30/2024: Patient seen alongside patient's husband, son, daughter-in-law and daughter members.  Patient looks much better today.  No new complaints.  Back pain is controlled.  Pursue disposition.  Assessment & Plan:   Principal Problem:   TIA (transient ischemic attack) Active Problems:   Abnormality of gait and mobility   Acute midline low back pain without sciatica   Asthma with COPD (chronic obstructive pulmonary disease) (HCC)   Lumbar radiculopathy   Esophageal reflux   Essential hypertension   History of DVT of lower extremity    Hyperlipidemia   Paroxysmal A-fib (HCC)   Subdural hematoma (HCC)   Subdural hematoma: - CT head done earlier today revealed slightly increased size (from 7 mm to 9 mm). - Patient was on Xarelto  prior to presentation. - Patient had a fall about 2 weeks ago. - Xarelto  is currently on hold. - Neurosurgery team is assisting in directing care.  - Repeat CT head in 2 weeks. - Follow-up with neurosurgery team based out of [facility], [place], in 2 weeks.  Acute L1 fracture: - TLSO - Optimize pain control.  Atrial fibrillation with rapid ventricular response: - Xarelto  is on hold. - Controlled heart rate. - Optimize pain control. - Last documented heart rate was 87 bpm.  Heart rate has ranged from 65 to 142 bpm.  Hypertension: - Continue to optimize.  Asthma/COPD: - Stable.  DVT prophylaxis: SCD. Code Status: Full code Family Communication: Husband. Disposition Plan: This will depend on hospital course   Consultants:  Neurosurgery (Dr. Debby and Dr. Gillie)  Procedures:  None.  Antimicrobials:  None.   Subjective: No new complaints.  Objective: Vitals:   05/29/24 2328 05/30/24 0308 05/30/24 0747 05/30/24 0810  BP: 111/83 108/78 133/86 133/86  Pulse: (!) 105 (!) 104 (!) 116   Resp: 18 18 19    Temp: 98 F (36.7 C) (!) 97.5 F (36.4 C) 98.3 F (36.8 C)   TempSrc:   Oral   SpO2: 99% 94% 95%   Weight:      Height:        Intake/Output Summary (Last 24 hours) at 05/30/2024 1507  Last data filed at 05/30/2024 1252 Gross per 24 hour  Intake 708 ml  Output 200 ml  Net 508 ml   Filed Weights   05/27/24 1857 05/27/24 2325 05/28/24 0055  Weight: 73.8 kg 74 kg 69.8 kg    Examination:  General exam: Appears calm and comfortable. Respiratory system: Clear to auscultation.  Cardiovascular system: S1 & S2, irregularly irregular.   Gastrointestinal system: Abdomen is soft and nontender. Central nervous system: Awake and alert.     Data Reviewed: I  have personally reviewed following labs and imaging studies  CBC: Recent Labs  Lab 05/27/24 1856 05/27/24 1857  WBC 11.8*  --   NEUTROABS 7.3  --   HGB 13.6 14.3  HCT 42.8 42.0  MCV 96.0  --   PLT 264  --    Basic Metabolic Panel: Recent Labs  Lab 05/27/24 1856 05/27/24 1857  NA 132* 135  K 4.0 3.9  CL 102 105  CO2 19*  --   GLUCOSE 115* 116*  BUN 14 14  CREATININE 0.78 0.70  CALCIUM  9.2  --    GFR: Estimated Creatinine Clearance: 47.5 mL/min (by C-G formula based on SCr of 0.7 mg/dL). Liver Function Tests: Recent Labs  Lab 05/27/24 1856  AST 19  ALT 31  ALKPHOS 201*  BILITOT 0.7  PROT 6.8  ALBUMIN 3.4*   No results for input(s): LIPASE, AMYLASE in the last 168 hours. No results for input(s): AMMONIA in the last 168 hours. Coagulation Profile: Recent Labs  Lab 05/27/24 1856  INR 2.3*   Cardiac Enzymes: No results for input(s): CKTOTAL, CKMB, CKMBINDEX, TROPONINI in the last 168 hours. BNP (last 3 results) No results for input(s): PROBNP in the last 8760 hours. HbA1C: Recent Labs    05/28/24 0255  HGBA1C 5.7*   CBG: Recent Labs  Lab 05/27/24 1850  GLUCAP 113*   Lipid Profile: Recent Labs    05/28/24 0255  CHOL 140  HDL 60  LDLCALC 69  TRIG 55  CHOLHDL 2.3   Thyroid Function Tests: No results for input(s): TSH, T4TOTAL, FREET4, T3FREE, THYROIDAB in the last 72 hours. Anemia Panel: No results for input(s): VITAMINB12, FOLATE, FERRITIN, TIBC, IRON, RETICCTPCT in the last 72 hours. Urine analysis: No results found for: COLORURINE, APPEARANCEUR, LABSPEC, PHURINE, GLUCOSEU, HGBUR, BILIRUBINUR, KETONESUR, PROTEINUR, UROBILINOGEN, NITRITE, LEUKOCYTESUR Sepsis Labs: @LABRCNTIP (procalcitonin:4,lacticidven:4)  )No results found for this or any previous visit (from the past 240 hours).       Radiology Studies: CT HEAD WO CONTRAST ( ) Result Date: 05/28/2024 CLINICAL DATA:   Subdural hematoma EXAM: CT HEAD WITHOUT CONTRAST TECHNIQUE: Contiguous axial images were obtained from the base of the skull through the vertex without intravenous contrast. RADIATION DOSE REDUCTION: This exam was performed according to the departmental dose-optimization program which includes automated exposure control, adjustment of the mA and/or kV according to patient size and/or use of iterative reconstruction technique. COMPARISON:  CT head 05/28/2024. FINDINGS: Brain: Unchanged 9 mm thick left cerebral convexity subdural hematoma. Unchanged mass effect. No evidence of acute large vascular territory infarct, significant midline shift or hydrocephalus. Remote lacunar infarct in the right thalamus. Unchanged approximately 1 cm meningioma along the right petrous ridge. Vascular: No hyperdense vessel or unexpected calcification. Skull: No acute fracture. Sinuses/Orbits: Right maxillary sinus mucosal thickening. No acute orbital findings. Other: No mastoid effusions. IMPRESSION: Unchanged 9 mm thick left cerebral convexity subdural hematoma. Unchanged mass effect. Electronically Signed   By: Gilmore GORMAN Molt M.D.   On: 05/28/2024 19:28  Scheduled Meds:  calcium  carbonate  500 mg Oral QAC supper   cycloSPORINE   1 drop Both Eyes BID   diltiazem   120 mg Oral Daily   gabapentin   600 mg Oral Daily   melatonin  5 mg Oral QHS   Continuous Infusions:     LOS: 0 days    Time spent: 35 minutes.    Leatrice Chapel, MD  [facility] Pager #: [phone] 7PM-7AM contact night coverage as above

## 2024-05-30 NOTE — Progress Notes (Signed)
 Patient ID: Katherine Porter, female   DOB: 08/26/1935, 89 y.o.   MRN: [id number] BP 133/86   Pulse (!) 116   Temp 98.3 F (36.8 C) (Oral)   Resp 19   Ht 5' 4 (1.626 m)   Wt 69.8 kg   SpO2 95%   BMI 26.41 kg/m  Alert and oriented x 4, speech is clear and fluent Moving all extremities well Back feels ok Will follow with her neurosurgeon at [facility].

## 2024-05-30 NOTE — Plan of Care (Signed)
  Problem: Education: Goal: Knowledge of General Education information will improve Description: Including pain rating scale, medication(s)/side effects and non-pharmacologic comfort measures Outcome: Progressing   Problem: Health Behavior/Discharge Planning: Goal: Ability to manage health-related needs will improve Outcome: Progressing   Problem: Clinical Measurements: Goal: Ability to maintain clinical measurements within normal limits will improve Outcome: Progressing Goal: Will remain free from infection Outcome: Progressing Goal: Diagnostic test results will improve Outcome: Progressing Goal: Respiratory complications will improve Outcome: Progressing Goal: Cardiovascular complication will be avoided Outcome: Progressing   Problem: Activity: Goal: Risk for activity intolerance will decrease Outcome: Progressing   Problem: Nutrition: Goal: Adequate nutrition will be maintained Outcome: Progressing   Problem: Coping: Goal: Level of anxiety will decrease Outcome: Progressing   Problem: Elimination: Goal: Will not experience complications related to bowel motility Outcome: Progressing Goal: Will not experience complications related to urinary retention Outcome: Progressing   Problem: Pain Managment: Goal: General experience of comfort will improve and/or be controlled Outcome: Progressing   Problem: Safety: Goal: Ability to remain free from injury will improve Outcome: Progressing   Problem: Skin Integrity: Goal: Risk for impaired skin integrity will decrease Outcome: Progressing   Problem: Education: Goal: Knowledge of disease or condition will improve Outcome: Progressing Goal: Knowledge of secondary prevention will improve (MUST DOCUMENT ALL) Outcome: Progressing Goal: Knowledge of patient specific risk factors will improve (DELETE if not current risk factor) Outcome: Progressing   Problem: Ischemic Stroke/TIA Tissue Perfusion: Goal: Complications of  ischemic stroke/TIA will be minimized Outcome: Progressing   Problem: Coping: Goal: Will verbalize positive feelings about self Outcome: Progressing Goal: Will identify appropriate support needs Outcome: Progressing   Problem: Health Behavior/Discharge Planning: Goal: Ability to manage health-related needs will improve Outcome: Progressing Goal: Goals will be collaboratively established with patient/family Outcome: Progressing   Problem: Self-Care: Goal: Ability to participate in self-care as condition permits will improve Outcome: Progressing Goal: Verbalization of feelings and concerns over difficulty with self-care will improve Outcome: Progressing Goal: Ability to communicate needs accurately will improve Outcome: Progressing   Problem: Nutrition: Goal: Risk of aspiration will decrease Outcome: Progressing Goal: Dietary intake will improve Outcome: Progressing   Problem: Activity: Goal: Ability to tolerate increased activity will improve Outcome: Progressing   Problem: Cardiac: Goal: Ability to achieve and maintain adequate cardiopulmonary perfusion will improve Outcome: Progressing

## 2024-05-31 ENCOUNTER — Observation Stay (HOSPITAL_COMMUNITY)

## 2024-05-31 DIAGNOSIS — G459 Transient cerebral ischemic attack, unspecified: Secondary | ICD-10-CM | POA: Diagnosis not present

## 2024-05-31 LAB — GLUCOSE, CAPILLARY: Glucose-Capillary: 100 mg/dL — ABNORMAL HIGH (ref 70–99)

## 2024-05-31 MED ORDER — LIDOCAINE 5 % EX PTCH
1.0000 | MEDICATED_PATCH | CUTANEOUS | Status: DC
  Administered 2024-05-31: 1 via TRANSDERMAL
  Filled 2024-05-31: qty 1

## 2024-05-31 MED ORDER — LIDOCAINE 5 % EX PTCH
1.0000 | MEDICATED_PATCH | CUTANEOUS | 0 refills | Status: DC
Start: 2024-06-01 — End: 2024-05-31

## 2024-05-31 MED ORDER — OXYCODONE HCL 5 MG PO TABS: 5.0000 mg | ORAL_TABLET | Freq: Four times a day (QID) | ORAL | 0 refills | Status: AC | PRN

## 2024-05-31 MED ORDER — LIDOCAINE 5 % EX PTCH: 1.0000 | MEDICATED_PATCH | CUTANEOUS | 0 refills | Status: AC

## 2024-05-31 NOTE — Progress Notes (Signed)
 Report given to Sandy,LPN to [phone]. All questions asked and answered. AVS and Discharge packet ready; family at bedside. Will review discharge with family at bedside. Pt awaiting PTAR. IV removed. Pt in no distress.

## 2024-05-31 NOTE — TOC Transition Note (Signed)
 Transition of Care [facility]) - Discharge Note   Patient Details  Name: Katherine Porter MRN: [id number] Date of Birth: 07/31/1935  [facility] CM/SW Contact:  Wymon Swaney A [place], LCSW Phone Number: 05/31/2024, 4:41 PM   Clinical Narrative:     Patient will DC to: [facility]  Anticipated DC date: 05/31/24  Family notified: Rosina Pellant  Transport by: [facility]      Per MD patient ready for DC to [facility]. RN, patient, patient's family, and facility notified of DC. Discharge Summary and FL2 sent to facility. RN to call report prior to discharge (Room 118, [phone]). DC packet on chart.  CSW notified Whitney at [facility] of requested palliative consult at facility in event pt and pt's family unable to discuss with Palliative care team here at [facility]. Ambulance transport requested for patient.    CSW will sign off for now as social work intervention is no longer needed. Please consult us  again if new needs arise.     Barriers to Discharge: Insurance Authorization   Patient Goals and CMS Choice            Discharge Placement                       Discharge Plan and Services Additional resources added to the After Visit Summary for                                       Social Drivers of Health (SDOH) Interventions SDOH Screenings   Food Insecurity: No Food Insecurity (05/28/2024)  Housing: Low Risk  (05/28/2024)  Transportation Needs: No Transportation Needs (05/28/2024)  Utilities: Not At Risk (05/28/2024)  Social Connections: Moderately Integrated (05/28/2024)  Tobacco Use: Medium Risk (05/27/2024)     Readmission Risk Interventions     No data to display

## 2024-05-31 NOTE — TOC Progression Note (Signed)
 Transition of Care [facility]) - Progression Note    Patient Details  Name: Katherine Porter MRN: [id number] Date of Birth: 1934/11/22  [facility]) CM/SW Contact  Ericca Labra A [place], LCSW Phone Number: 05/31/2024, 10:16 AM  Clinical Narrative:     Pt's authorization is approved.  Auth ID [id number] Reference ID [id number]  Approval Dates:  05/30/2024-06/02/2024   MD notified, awaiting decision on DC given pt's recent fall.   Inpatient case management will continue to follow.    Barriers to Discharge: Insurance Authorization               Expected Discharge Plan and Services         Expected Discharge Date: 05/29/24                                     Social Drivers of Health (SDOH) Interventions SDOH Screenings   Food Insecurity: No Food Insecurity (05/28/2024)  Housing: Low Risk  (05/28/2024)  Transportation Needs: No Transportation Needs (05/28/2024)  Utilities: Not At Risk (05/28/2024)  Social Connections: Moderately Integrated (05/28/2024)  Tobacco Use: Medium Risk (05/27/2024)    Readmission Risk Interventions     No data to display

## 2024-05-31 NOTE — Progress Notes (Signed)
 PT Cancellation Note  Patient Details Name: Katherine Porter MRN: [id number] DOB: 20-Jun-1935   Cancelled Treatment:    Reason Eval/Treat Not Completed: Patient not medically ready. Pt with AMS in the middle of the night, went for STAT CT, then had a fall going to the bathroom around 630am this morning. Pt now with more confusion. Will await new mobility orders from MD prior to re-assessing pt mobility.  Norene Ames, PT, DPT Acute Rehabilitation Services Secure chat preferred Office #: [phone]    Norene CHRISTELLA Ames 05/31/2024, 8:47 AM

## 2024-05-31 NOTE — Progress Notes (Addendum)
 I called by cna tamika for the pt she founded on the floor as her sitter said she tried to take her to bathroom ? , pt was sitting on her butt , co head trauma  no wound or laceration of her head, she got lt knee + rt forearm skin tear , pt has same previous episodes of difficulty of speech and confusing , unable to answer my question , T.Opyd MD Informed just assessment question , no new order he said another provider will be here soon to assess her , vs wdl , post fall assessment done  , dressing done for the new skin tear , safety precautions for fall maintained ,she is on tele , room air , her pain score 1/10 , charge nurse +family informed .

## 2024-05-31 NOTE — Plan of Care (Signed)
  Problem: Education: Goal: Knowledge of General Education information will improve Description: Including pain rating scale, medication(s)/side effects and non-pharmacologic comfort measures Outcome: Adequate for Discharge   Problem: Health Behavior/Discharge Planning: Goal: Ability to manage health-related needs will improve Outcome: Adequate for Discharge   Problem: Clinical Measurements: Goal: Ability to maintain clinical measurements within normal limits will improve Outcome: Adequate for Discharge Goal: Will remain free from infection Outcome: Adequate for Discharge Goal: Diagnostic test results will improve Outcome: Adequate for Discharge Goal: Respiratory complications will improve Outcome: Adequate for Discharge Goal: Cardiovascular complication will be avoided Outcome: Adequate for Discharge   Problem: Activity: Goal: Risk for activity intolerance will decrease Outcome: Adequate for Discharge   Problem: Nutrition: Goal: Adequate nutrition will be maintained Outcome: Adequate for Discharge   Problem: Coping: Goal: Level of anxiety will decrease Outcome: Adequate for Discharge   Problem: Elimination: Goal: Will not experience complications related to bowel motility Outcome: Adequate for Discharge Goal: Will not experience complications related to urinary retention Outcome: Adequate for Discharge   Problem: Pain Managment: Goal: General experience of comfort will improve and/or be controlled Outcome: Adequate for Discharge   Problem: Safety: Goal: Ability to remain free from injury will improve Outcome: Adequate for Discharge   Problem: Skin Integrity: Goal: Risk for impaired skin integrity will decrease Outcome: Adequate for Discharge   Problem: Education: Goal: Knowledge of disease or condition will improve Outcome: Adequate for Discharge Goal: Knowledge of secondary prevention will improve (MUST DOCUMENT ALL) Outcome: Adequate for Discharge Goal:  Knowledge of patient specific risk factors will improve (DELETE if not current risk factor) Outcome: Adequate for Discharge   Problem: Ischemic Stroke/TIA Tissue Perfusion: Goal: Complications of ischemic stroke/TIA will be minimized Outcome: Adequate for Discharge   Problem: Coping: Goal: Will verbalize positive feelings about self Outcome: Adequate for Discharge Goal: Will identify appropriate support needs Outcome: Adequate for Discharge   Problem: Health Behavior/Discharge Planning: Goal: Ability to manage health-related needs will improve Outcome: Adequate for Discharge Goal: Goals will be collaboratively established with patient/family Outcome: Adequate for Discharge   Problem: Self-Care: Goal: Ability to participate in self-care as condition permits will improve Outcome: Adequate for Discharge Goal: Verbalization of feelings and concerns over difficulty with self-care will improve Outcome: Adequate for Discharge Goal: Ability to communicate needs accurately will improve Outcome: Adequate for Discharge   Problem: Nutrition: Goal: Risk of aspiration will decrease Outcome: Adequate for Discharge Goal: Dietary intake will improve Outcome: Adequate for Discharge   Problem: Activity: Goal: Ability to tolerate increased activity will improve Outcome: Adequate for Discharge   Problem: Cardiac: Goal: Ability to achieve and maintain adequate cardiopulmonary perfusion will improve Outcome: Adequate for Discharge   Problem: Acute Rehab PT Goals(only PT should resolve) Goal: Pt Will Go Supine/Side To Sit Outcome: Adequate for Discharge Goal: Patient Will Transfer Sit To/From Stand Outcome: Adequate for Discharge Goal: Pt Will Ambulate Outcome: Adequate for Discharge   Problem: Acute Rehab OT Goals (only OT should resolve) Goal: Pt. Will Perform Grooming Outcome: Adequate for Discharge Goal: Pt. Will Perform Lower Body Dressing Outcome: Adequate for Discharge Goal: Pt.  Will Transfer To Toilet Outcome: Adequate for Discharge Goal: Pt. Will Perform Toileting-Clothing Manipulation Outcome: Adequate for Discharge

## 2024-05-31 NOTE — Progress Notes (Signed)
 Physical Therapy Treatment Patient Details Name: Katherine Porter MRN: [id number] DOB: 1935/02/16 Today's Date: 05/31/2024   History of Present Illness Pt is an 89 yo female presenting to [facility] on 05/27/24 with acute onset dysarthria, confusion, and severe low back pain. Workup revealed L1 compression fx and L cerebral cortex SDH. PMH of afib, DVT.    PT Comments  Pt lethargic but easily awoken and participated in PT session. Pt confused with word finding difficulty but aware she had to use bathroom and was urgently trying to get to [facility]. Pt requiring min/modA for transfer and peri-care post toileting. Pt very fatigued post BM and returned to L sidelying in bed and immediately fell back to sleep. Pt remains appropriate for  inpatient rehab program < 3 hrs upon d/c. Acute PT to cont to follow.   If plan is discharge home, recommend the following: A little help with walking and/or transfers;A little help with bathing/dressing/bathroom;Assistance with cooking/housework;Direct supervision/assist for medications management;Direct supervision/assist for financial management;Assist for transportation;Help with stairs or ramp for entrance;Supervision due to cognitive status   Can travel by private vehicle        Equipment Recommendations  None recommended by PT    Recommendations for Other Services       Precautions / Restrictions Precautions Precautions: Fall Recall of Precautions/Restrictions: Impaired Precaution/Restrictions Comments: pt with L-1 compression fx Required Braces or Orthoses: Spinal Brace Spinal Brace: Thoracolumbosacral orthotic;Applied in sitting position (when OOB) Restrictions Weight Bearing Restrictions Per Provider Order: No     Mobility  Bed Mobility Overal bed mobility: Needs Assistance Bed Mobility: Rolling, Sidelying to Sit, Sit to Sidelying Rolling: Min assist Sidelying to sit: Min assist     Sit to sidelying: Min assist General bed mobility comments: reinforced  education of log roll, max verbal and tactile directional cues    Transfers Overall transfer level: Needs assistance Equipment used: Rolling walker (2 wheels) Transfers: Sit to/from Stand, Bed to chair/wheelchair/BSC Sit to Stand: Min assist   Step pivot transfers: Min assist       General transfer comment: minA face to face transfer, pt reaching with UEs, short step height and length, moving quickly due to having to go to the bathroom    Ambulation/Gait                   Stairs             Wheelchair Mobility     Tilt Bed    Modified Rankin (Stroke Patients Only) Modified Rankin (Stroke Patients Only) Pre-Morbid Rankin Score: No significant disability Modified Rankin: Moderate disability     Balance Overall balance assessment: Needs assistance Sitting-balance support: Feet supported, No upper extremity supported Sitting balance-Leahy Scale: Good     Standing balance support: During functional activity, Reliant on assistive device for balance Standing balance-Leahy Scale: Fair Standing balance comment: needs outside assist                            Communication Communication Communication: No apparent difficulties  Cognition Arousal: Lethargic Behavior During Therapy: WFL for tasks assessed/performed   PT - Cognitive impairments: No family/caregiver present to determine baseline, Orientation   Orientation impairments: Place, Time, Situation                     Following commands: Intact      Cueing Cueing Techniques: Verbal cues, Gestural cues, Tactile cues  Exercises  General Comments General comments (skin integrity, edema, etc.): VSS, assisted to [facility], minA for hygiene s/p BM and urination      Pertinent Vitals/Pain Pain Assessment Pain Assessment: Faces Faces Pain Scale: Hurts little more Pain Location: back, L knee, L forearm, R UE IV site Pain Descriptors / Indicators: Discomfort Pain Intervention(s):  Monitored during session    Home Living                          Prior Function            PT Goals (current goals can now be found in the care plan section) Acute Rehab PT Goals PT Goal Formulation: Patient unable to participate in goal setting Time For Goal Achievement: 06/12/24 Potential to Achieve Goals: Good Progress towards PT goals: Not progressing toward goals - comment (episode of AMS)    Frequency    Min 2X/week      PT Plan      Co-evaluation              AM-PAC PT 6 Clicks Mobility   Outcome Measure  Help needed turning from your back to your side while in a flat bed without using bedrails?: A Little Help needed moving from lying on your back to sitting on the side of a flat bed without using bedrails?: A Little Help needed moving to and from a bed to a chair (including a wheelchair)?: A Little Help needed standing up from a chair using your arms (e.g., wheelchair or bedside chair)?: A Lot Help needed to walk in hospital room?: A Lot Help needed climbing 3-5 steps with a railing? : A Lot 6 Click Score: 15    End of Session Equipment Utilized During Treatment: Back brace Activity Tolerance: Patient tolerated treatment well Patient left: with call bell/phone within reach;with family/visitor present;in bed;with bed alarm set (in L sidelying) Nurse Communication: Mobility status (+BM) PT Visit Diagnosis: Unsteadiness on feet (R26.81);Other abnormalities of gait and mobility (R26.89);Muscle weakness (generalized) (M62.81);Difficulty in walking, not elsewhere classified (R26.2);Other symptoms and signs involving the nervous system (R29.898)     Time: 8582-8552 PT Time Calculation (min) (ACUTE ONLY): 30 min  Charges:    $Therapeutic Activity: 23-37 mins PT General Charges $$ ACUTE PT VISIT: 1 Visit                         Tarshia Kot M Kayode Petion 05/31/2024, 2:56 PM

## 2024-05-31 NOTE — Progress Notes (Signed)
 Patient was seen for change in mental status.   She presented the evening of 7/24 with speech disturbance and was found to have a subdural hematoma. She was admitted, Xarelto  was held, she was seen by neurosurgery, and has had repeat CTs. The last CT showed stable SDH and patient's speech returned to baseline.   She is said to have been fully oriented with fluent speech earlier in the shift and now woke with dysarthria and disorientation. Disorientation is improving but she is dysarthric. No new focal weakness detected.   Plan for stat head CT.

## 2024-05-31 NOTE — Progress Notes (Signed)
 05/31/24 0640  What Happened  Was fall witnessed? Yes  Who witnessed fall?  (naya lou[employer] Runner, broadcasting/film/video fron always [facility] ))  Patients activity before fall bathroom-assisted;ambulating-assisted  Point of contact buttocks  Was patient injured? Yes (lt knee skin tear)  Provider Notification  Provider Name/Title T.Opyd MD  Date Provider Notified 05/31/24  Time Provider Notified [phone]  Method of Notification Page  Notification Reason Fall  Provider response No new orders (i answered his question about head trauma his visit :I'm unfortunately getting inundated with pages and chats right now and won't be able to assess her but Dr. Rosario should be here shortly)  Date of Provider Response 05/31/24  Time of Provider Response 0703  Follow Up  Family notified Yes - comment  Time family notified 0710  Simple treatment Dressing (lt knee)  Progress note created (see row info) Yes  Adult Fall Risk Assessment  Risk Factor Category (scoring not indicated) Fall has occurred during this admission (document High fall risk);High fall risk per protocol (document High fall risk)  Age 89  Fall History: Fall within 6 months prior to admission 5  Elimination; Bowel and/or Urine Incontinence 2  Elimination; Bowel and/or Urine Urgency/Frequency 0  Medications: includes PCA/Opiates, Anti-convulsants, Anti-hypertensives, Diuretics, Hypnotics, Laxatives, Sedatives, and Psychotropics 5  Patient Care Equipment 2  Mobility-Assistance 2  Mobility-Gait 2  Mobility-Sensory Deficit 2  Altered awareness of immediate physical environment 1  Impulsiveness 0  Lack of understanding of one's physical/cognitive limitations 4  Total Score 28  Patient Fall Risk Level High fall risk  Adult Fall Risk Interventions  Required Bundle Interventions *See Row Information* High fall risk - low, moderate, and high requirements implemented  Additional Interventions Safety Sitter/Safety Rounder;Use of appropriate toileting equipment  (bedpan, BSC, etc.)  Fall intervention(s) refused/Patient educated regarding refusal Bed alarm;Nonskid socks  Screening for Fall Injury Risk (To be completed on HIGH fall risk patients) - Assessing Need for Floor Mats  Risk For Fall Injury- Criteria for Floor Mats 85 years or older;Previous fall this admission;Confusion/dementia (+NuDESC, CIWA, TBI, etc.);Noncompliant with safety precautions  Will Implement Floor Mats Yes  Vitals  Temp 97.6 F (36.4 C)  Temp Source Oral  BP 129/78  MAP (mmHg) 93  BP Location Left Arm  BP Method Automatic  Patient Position (if appropriate) Lying  Pulse Rate (!) 103  Pulse Rate Source Monitor  Oxygen Therapy  SpO2 96 %  O2 Device Room Air  Pain Assessment  Pain Location Knee  Pain Orientation Left  PAINAD (Pain Assessment in Advanced Dementia)  Breathing 0  Negative Vocalization 0  Facial Expression 0  Body Language 0  Consolability 0  PAINAD Score 0  PCA/Epidural/Spinal Assessment  Respiratory Pattern Regular;Unlabored  Neurological  Neuro (WDL) X  Level of Consciousness Alert  Orientation Level Disoriented to person;Disoriented to place;Disoriented to time;Disoriented to situation  Engineer, production;Incomprehensible  R Pupil Size (mm) 3  R Pupil Shape Round  R Pupil Reaction Brisk  L Pupil Size (mm) 3  L Pupil Shape Round  L Pupil Reaction Brisk  Motor Function/Sensation Assessment Grip  R Hand Grip Moderate  L Hand Grip Moderate  R Foot Plantar Flexion Moderate  L Foot Plantar Flexion Moderate  RUE Motor Response Responds to commands  RUE Sensation Full sensation  RUE Motor Strength 5  LUE Motor Response Responds to commands  LUE Sensation Full sensation  LUE Motor Strength 5  RLE Motor Response Responds to commands  RLE Sensation Full  sensation  RLE Motor Strength 4  LLE Motor Response Responds to commands  LLE Sensation Full sensation  LLE Motor Strength 4  Neuro Symptoms Forgetful   Glasgow Coma Scale  Eye Opening 4  Best Verbal Response (NON-intubated) 4  Best Motor Response 6  Glasgow Coma Scale Score 14  Stroke date/time  Last date known well 05/31/21  Last time known well 0400  Date symptoms discovered 05/31/24  Time symptoms discovered 0640  NIH Stroke Scale   Dizziness Present No  Headache Present No  Interval Neuro change  Level of Consciousness (1a.)    0  LOC Questions (1b. )    1  LOC Commands (1c. )    0  Best Gaze (2. )   0  Visual (3. )   0  Facial Palsy (4. )     0  Motor Arm, Left (5a. )    0  Motor Arm, Right (5b. )  0  Motor Leg, Left (6a. )   0  Motor Leg, Right (6b. )  0  Limb Ataxia (7. ) 0  Sensory (8. )   0  Best Language (9. )   0  Dysarthria (10. ) 1  Extinction/Inattention (11.)    0  Complete NIHSS TOTAL 2  Musculoskeletal  Musculoskeletal (WDL) X  Assistive Device BSC  Generalized Weakness Yes  Weight Bearing Restrictions Per Provider Order No  Integumentary  Integumentary (WDL) X  Nurse assisting at admit/transfer - Full skin assessment crystal LPN  Skin Color Appropriate for ethnicity  Skin Condition Dry  Skin Integrity Abrasion;Other (Comment) (skin tear lt knee lt knee , rt forame)  Abrasion Location Arm  Abrasion Location Orientation Right  Abrasion Intervention Cleansed  Skin Turgor Non-tenting  Pain Assessment  Date Pain First Started 05/31/24  Result of Injury Yes  Pain Assessment  Work-Related Injury No

## 2024-06-04 ENCOUNTER — Emergency Department (HOSPITAL_COMMUNITY)

## 2024-06-04 ENCOUNTER — Emergency Department (HOSPITAL_COMMUNITY)
Admission: EM | Admit: 2024-06-04 | Discharge: 2024-06-04 | Disposition: A | Discharge status: Home / Self Care | Attending: Emergency Medicine | Admitting: Emergency Medicine

## 2024-06-04 ENCOUNTER — Encounter (HOSPITAL_COMMUNITY): Payer: Self-pay

## 2024-06-04 ENCOUNTER — Other Ambulatory Visit: Payer: Self-pay

## 2024-06-04 DIAGNOSIS — R4701 Aphasia: Secondary | ICD-10-CM | POA: Insufficient documentation

## 2024-06-04 DIAGNOSIS — G319 Degenerative disease of nervous system, unspecified: Secondary | ICD-10-CM | POA: Insufficient documentation

## 2024-06-04 DIAGNOSIS — I6203 Nontraumatic chronic subdural hemorrhage: Secondary | ICD-10-CM | POA: Insufficient documentation

## 2024-06-04 DIAGNOSIS — Z87891 Personal history of nicotine dependence: Secondary | ICD-10-CM | POA: Insufficient documentation

## 2024-06-04 DIAGNOSIS — I4891 Unspecified atrial fibrillation: Secondary | ICD-10-CM | POA: Insufficient documentation

## 2024-06-04 DIAGNOSIS — R9082 White matter disease, unspecified: Secondary | ICD-10-CM | POA: Insufficient documentation

## 2024-06-04 DIAGNOSIS — Z86718 Personal history of other venous thrombosis and embolism: Secondary | ICD-10-CM | POA: Insufficient documentation

## 2024-06-04 DIAGNOSIS — G459 Transient cerebral ischemic attack, unspecified: Secondary | ICD-10-CM

## 2024-06-04 DIAGNOSIS — Z7901 Long term (current) use of anticoagulants: Secondary | ICD-10-CM | POA: Insufficient documentation

## 2024-06-04 DIAGNOSIS — R4781 Slurred speech: Secondary | ICD-10-CM | POA: Insufficient documentation

## 2024-06-04 DIAGNOSIS — R410 Disorientation, unspecified: Secondary | ICD-10-CM | POA: Insufficient documentation

## 2024-06-04 DIAGNOSIS — I69228 Other speech and language deficits following other nontraumatic intracranial hemorrhage: Secondary | ICD-10-CM | POA: Diagnosis not present

## 2024-06-04 DIAGNOSIS — R29810 Facial weakness: Secondary | ICD-10-CM | POA: Diagnosis not present

## 2024-06-04 DIAGNOSIS — Z8673 Personal history of transient ischemic attack (TIA), and cerebral infarction without residual deficits: Secondary | ICD-10-CM | POA: Insufficient documentation

## 2024-06-04 LAB — DIFFERENTIAL
Abs Immature Granulocytes: 0.05 K/uL (ref 0.00–0.07)
Basophils Absolute: 0 K/uL (ref 0.0–0.1)
Basophils Relative: 1 %
Eosinophils Absolute: 0.1 K/uL (ref 0.0–0.5)
Eosinophils Relative: 1 %
Immature Granulocytes: 1 %
Lymphocytes Relative: 28 %
Lymphs Abs: 2.4 K/uL (ref 0.7–4.0)
Monocytes Absolute: 1 K/uL (ref 0.1–1.0)
Monocytes Relative: 12 %
Neutro Abs: 4.9 K/uL (ref 1.7–7.7)
Neutrophils Relative %: 57 %

## 2024-06-04 LAB — I-STAT CHEM 8, ED
BUN: 13 mg/dL (ref 8–23)
Calcium, Ion: 1.13 mmol/L — ABNORMAL LOW (ref 1.15–1.40)
Chloride: 108 mmol/L (ref 98–111)
Creatinine, Ser: 0.7 mg/dL (ref 0.44–1.00)
Glucose, Bld: 97 mg/dL (ref 70–99)
HCT: 41 % (ref 36.0–46.0)
Hemoglobin: 13.9 g/dL (ref 12.0–15.0)
Potassium: 3.7 mmol/L (ref 3.5–5.1)
Sodium: 138 mmol/L (ref 135–145)
TCO2: 20 mmol/L — ABNORMAL LOW (ref 22–32)

## 2024-06-04 LAB — CBC
HCT: 40.5 % (ref 36.0–46.0)
Hemoglobin: 13.2 g/dL (ref 12.0–15.0)
MCH: 30.3 pg (ref 26.0–34.0)
MCHC: 32.6 g/dL (ref 30.0–36.0)
MCV: 92.9 fL (ref 80.0–100.0)
Platelets: 235 K/uL (ref 150–400)
RBC: 4.36 MIL/uL (ref 3.87–5.11)
RDW: 14.6 % (ref 11.5–15.5)
WBC: 8.5 K/uL (ref 4.0–10.5)
nRBC: 0 % (ref 0.0–0.2)

## 2024-06-04 LAB — CBG MONITORING, ED: Glucose-Capillary: 107 mg/dL — ABNORMAL HIGH (ref 70–99)

## 2024-06-04 LAB — PROTIME-INR
INR: 1 (ref 0.8–1.2)
Prothrombin Time: 13.9 s (ref 11.4–15.2)

## 2024-06-04 LAB — COMPREHENSIVE METABOLIC PANEL WITH GFR
ALT: 29 U/L (ref 0–44)
AST: 20 U/L (ref 15–41)
Albumin: 3.1 g/dL — ABNORMAL LOW (ref 3.5–5.0)
Alkaline Phosphatase: 188 U/L — ABNORMAL HIGH (ref 38–126)
Anion gap: 8 (ref 5–15)
BUN: 13 mg/dL (ref 8–23)
CO2: 21 mmol/L — ABNORMAL LOW (ref 22–32)
Calcium: 8.8 mg/dL — ABNORMAL LOW (ref 8.9–10.3)
Chloride: 108 mmol/L (ref 98–111)
Creatinine, Ser: 0.69 mg/dL (ref 0.44–1.00)
GFR, Estimated: >60 mL/min
Glucose, Bld: 99 mg/dL (ref 70–99)
Potassium: 3.7 mmol/L (ref 3.5–5.1)
Sodium: 137 mmol/L (ref 135–145)
Total Bilirubin: 0.4 mg/dL (ref 0.0–1.2)
Total Protein: 6.4 g/dL — ABNORMAL LOW (ref 6.5–8.1)

## 2024-06-04 LAB — ETHANOL: Alcohol, Ethyl (B): <15 mg/dL (ref <15)

## 2024-06-04 LAB — APTT: aPTT: 34 s (ref 24–36)

## 2024-06-04 MED ORDER — SODIUM CHLORIDE 0.9% FLUSH
3.0000 mL | Freq: Once | INTRAVENOUS | Status: AC
  Administered 2024-06-04: 3 mL via INTRAVENOUS

## 2024-06-04 NOTE — Discharge Instructions (Signed)
 Thank you for allowing us  to care for you today.  You came to the emergency department because your family members noticed that you were having slurred speech.  During your evaluation, there were no new findings on your CT head imaging.  Your laboratory studies are also within normal limits.  Please follow-up with your primary care provider in the outpatient setting as well as neurosurgery in the outpatient setting.  Please continue to hold your blood thinning medications until you are cleared by neurosurgery.  Please return to the emergency department if you experience worsening pain, recurrent symptoms, chest pain, shortness of breath, passout or believe you need emergent medical care  We hope you feel better soon

## 2024-06-04 NOTE — Code Documentation (Signed)
 Stroke Response Nurse Documentation Code Documentation  Denetria Luevanos is a 89 y.o. female arriving to [facility]  via [facility] on 06/04/2024 with past medical hx of essential hypertension, paroxysmal afib, TIA, hyperlipidemia, and SDH from fall 05/28/2024. Stopped taking Xeralto post SDH. Code stroke was activated by EMS.   Patient from SNF where she was LKW at 82 and family noted sudden slurred speech and left facial droop.   Stroke team at the bedside on patient arrival. Labs unable to be drawn and patient cleared for CT by EDP. Patient to CT with team. NIHSS 2, see documentation for details and code stroke times. Patient with disoriented on exam. The following imaging was completed:  CT Head. Patient is not a candidate for IV Thrombolytic due to recent SDH and too mild to treat. Patient is not a candidate for IR due to no LVO.   Care Plan: Q2 VS and NIHSS.   Process Delays Noted: difficulty getting labs at bridge  Bedside handoff with ED RN Andriette.    Madelin Manila Stroke Response RN

## 2024-06-04 NOTE — Consult Note (Signed)
 NEUROLOGY CONSULT NOTE   Date of service: June 04, 2024 Patient Name: Katherine Porter MRN:  [id number] DOB:  1935-09-07 Chief Complaint: CODE STROKE Requesting Provider: No att. providers found  History of Present Illness  Katherine Porter is a 89 y.o. female with hx of dvt, meningioma, afib, recent admission for TIA and SDH (no surgical intervention) discharged 7/26 with Xarelto  on hold who was BIB EMS as a CODE STROKE due to right facial droop and slurred speech. Per EMS, family noted that while they were sitting with the patient, at 1055 abruptly, she developed right facial droop and slurred speech.  On exam at bridge, patient is alert, disoriented to age and month, no facial droop seen, no aphasia or dysarthria, no visual field deficit (glasses at baseline), no drift or sensory deficit. BP in the 130s, CBG WNL.   LKW: 1055 Modified rankin score: 3-Moderate disability-requires help but walks WITHOUT assistance IV Thrombolysis: No, low NIH and baseline symptoms EVT: No, no LVO suspected  NIHSS components Score: Comment  1a Level of Conscious 0[x]  1[]  2[]  3[]      1b LOC Questions 0[]  1[]  2[x]       1c LOC Commands 0[x]  1[]  2[]       2 Best Gaze 0[x]  1[]  2[]       3 Visual 0[x]  1[]  2[]  3[]      4 Facial Palsy 0[x]  1[]  2[]  3[]      5a Motor Arm - left 0[x]  1[]  2[]  3[]  4[]  UN[]    5b Motor Arm - Right 0[x]  1[]  2[]  3[]  4[]  UN[]    6a Motor Leg - Left 0[x]  1[]  2[]  3[]  4[]  UN[]    6b Motor Leg - Right 0[x]  1[]  2[]  3[]  4[]  UN[]    7 Limb Ataxia 0[x]  1[]  2[]  UN[]      8 Sensory 0[x]  1[]  2[]  UN[]      9 Best Language 0[x]  1[]  2[]  3[]      10 Dysarthria 0[x]  1[]  2[]  UN[]      11 Extinct. and Inattention 0[x]  1[]  2[]       TOTAL:   2      ROS  Comprehensive ROS performed and pertinent positives documented in HPI   Past History   Past Medical History:  Diagnosis Date   Atrial fibrillation (HCC)    DVT (deep venous thrombosis) (HCC)     Past Surgical History:  Procedure Laterality Date    APPENDECTOMY     BREAST SURGERY     spleenectomy      Family History: No family history on file.  Social History  reports that she has quit smoking. She has never used smokeless tobacco. She reports current alcohol use of about 2.0 standard drinks of alcohol per week. She reports that she does not use drugs.  Allergies  Allergen Reactions   Aspirin Other (See Comments)    Pt is unable to take due to contraindication with other meds   Naproxen Other (See Comments)    Contraindicated due to medication list   Other Other (See Comments)    bruising   Tramadol Other (See Comments) and Nausea And Vomiting   Wound Dressing Adhesive Other (See Comments)    bruising   Codeine Nausea Only   Baclofen Other (See Comments)    Drowsiness   Drowsiness    Medications   Current Facility-Administered Medications:    sodium chloride  flush (NS) 0.9 % injection 3 mL, 3 mL, Intravenous, Once, Horton, Kristie M, DO  Current Outpatient Medications:    acetaminophen  (TYLENOL ) 500 MG tablet, Take  1,000 mg by mouth in the morning, at noon, and at bedtime., Disp: , Rfl:    atorvastatin (LIPITOR) 40 MG tablet, Take 40 mg by mouth at bedtime., Disp: , Rfl:    calcitonin, salmon, (MIACALCIN/FORTICAL) 200 UNIT/ACT nasal spray, Place 1 spray into alternate nostrils daily., Disp: , Rfl:    calcium  carbonate (TUMS) 500 MG chewable tablet, Chew 1 tablet (200 mg of elemental calcium  total) by mouth daily before supper., Disp: 30 tablet, Rfl: 0   Cyanocobalamin 1000 MCG SUBL, Take 1 tablet by mouth daily., Disp: , Rfl:    cycloSPORINE  (RESTASIS ) 0.05 % ophthalmic emulsion, Place 1 drop into both eyes 2 (two) times daily., Disp: , Rfl:    diltiazem  (CARDIZEM  CD) 120 MG 24 hr capsule, Take 120 mg by mouth daily., Disp: , Rfl:    gabapentin  (NEURONTIN ) 600 MG tablet, Take 600 mg by mouth at bedtime., Disp: , Rfl:    lidocaine  (LIDODERM ) 5 %, Place 1 patch onto the skin daily. Remove & Discard patch within 12 hours  or as directed by MD, Disp: 30 patch, Rfl: 0   oxyCODONE  (OXY IR/ROXICODONE ) 5 MG immediate release tablet, Take 1 tablet (5 mg total) by mouth every 6 (six) hours as needed for severe pain (pain score 7-10) or moderate pain (pain score 4-6) (acute surgical pain of the pelvis)., Disp: 30 tablet, Rfl: 0   polyethylene glycol (MIRALAX / GLYCOLAX) 17 g packet, Take 17 g by mouth daily. Take 17g by mouth once daily.  May take one additional dose as needed for constipation., Disp: , Rfl:    senna-docusate (SENOKOT-S) 8.6-50 MG tablet, Take 1 tablet by mouth at bedtime as needed for up to 15 days for moderate constipation., Disp: 15 tablet, Rfl: 0  Vitals   Vitals:   06/04/24 1214  BP: 113/81    There is no height or weight on file to calculate BMI.   Physical Exam   Constitutional: Appears well-developed and well-nourished.  Cardiovascular: Irregularly irregular Respiratory: Effort normal, non-labored breathing.   Neurologic Examination   Neuro: Mental Status: Patient is awake, alert, disoriented to age, month.  No signs of aphasia or neglect Cranial Nerves: II: Visual Fields are full. Pupils are equal, round, and reactive to light.   III,IV, VI: EOMI without ptosis or diploplia.  V: Facial sensation is symmetric to light touch  VII: Facial movement is symmetric.  VIII: hearing is intact to voice X: No dysarthria XI: Shoulder shrug is symmetric. XII: tongue is midline without atrophy or fasciculations.  Motor: Tone is normal. Bulk is normal. 5/5 strength was present in all four extremities. BLE ROM is limited by back pain at baseline, noted compression fracture per chart review.   Sensory: Sensation is symmetric to light touch in the arms and legs. Cerebellar: FNF and HKS are intact bilaterally   Labs/Imaging/Neurodiagnostic studies   CBC: No results for input(s): WBC, NEUTROABS, HGB, HCT, MCV, PLT in the last 168 hours. Basic Metabolic Panel:  Lab Results   Component Value Date   NA 135 05/27/2024   K 3.9 05/27/2024   CO2 19 (L) 05/27/2024   GLUCOSE 116 (H) 05/27/2024   BUN 14 05/27/2024   CREATININE 0.70 05/27/2024   CALCIUM  9.2 05/27/2024   GFRNONAA >60 05/27/2024   GFRAA >60 12/22/2017   Lipid Panel:  Lab Results  Component Value Date   LDLCALC 69 05/28/2024   HgbA1c:  Lab Results  Component Value Date   HGBA1C 5.7 (H) 05/28/2024   Urine  Drug Screen:     Component Value Date/Time   LABOPIA NONE DETECTED 05/27/2024 1856   COCAINSCRNUR NONE DETECTED 05/27/2024 1856   LABBENZ NONE DETECTED 05/27/2024 1856   AMPHETMU NONE DETECTED 05/27/2024 1856   THCU NONE DETECTED 05/27/2024 1856   LABBARB NONE DETECTED 05/27/2024 1856    Alcohol Level     Component Value Date/Time   ETH <15 05/27/2024 1856   INR  Lab Results  Component Value Date   INR 2.3 (H) 05/27/2024   APTT  Lab Results  Component Value Date   APTT 53 (H) 05/27/2024   AED levels: No results found for: PHENYTOIN, ZONISAMIDE, LAMOTRIGINE, LEVETIRACETA  CT Head without contrast(Personally reviewed): No acute intracranial abnormality. Chronic left subdural hematoma, measuring up to 7 mm in diameter, without evidence of interval hemorrhage. Chronic lacunar infarct in the right thalamus anteriorly. Moderate generalized cerebral volume loss. Mild-to-moderate periventricular and deep cerebral white matter disease.   ASSESSMENT   Brittnay Pigman is a 89 y.o. female with hx of dvt, meningioma, afib, recent admission for TIA and SDH (no surigcal intervention) discharged 7/26 with Xarelto  on hold who was BIB EMS as a CODE STROKE due to right facial droop and slurred speech.   On exam at bridge, patient is alert, disoriented to age and month, no facial droop seen, no aphasia or dysarthria, no visual field deficit (glasses at baseline), no drift or sensory deficit. BP in the 130s, CBG WNL.   TIA versus Recrudescence of previous SDH symptoms.    RECOMMENDATIONS   - no further imaging needed from neurology standpoint.  - continue to hold Xarelto  until patient can follow-up with Neurosurgery outpatient ______________________________________________________________________   Signed, Rocky JAYSON Likes, NP [facility]   NEUROHOSPITALIST ADDENDUM Performed a face to face diagnostic evaluation.   I have reviewed the contents of history and physical exam as documented by PA/ARNP/Resident and agree with above documentation.  I have discussed and formulated the above plan as documented. Edits to the note have been made as needed.  Impression/Key exam findings/Plan: no deficits on exam at all. Low suspicion for stroke. She is awake and interactive. CT Head negative. What EMS thought was a facial droop was likely a facial fold. I pulled up my phone camera and showed a picture to patient and she feels her face look normal and fine. Angle of mouth rises symmetrically. Low suspicion for stroke.  Loriel Diehl, MD [facility] [phone]   If 7pm to 7am, please call on call as listed on AMION.

## 2024-06-04 NOTE — ED Triage Notes (Addendum)
 Pt was BIB [facility] for code stroke from [facility] facility. The staff was concerned for left sided facial droop and slurred speech that had a sudden onset LKW 1055am. PMX of TIA and AFIB. Stroke team present on arrival as well as RN's.  Dementia at baseline. Pt Alert and oriented to self and DOB. Pt states she is 4 and at [facility]. Denies any pain, dizziness or headache.  Taken to CT on arrival.  BP 130/70 98% room air HR 120

## 2024-06-04 NOTE — ED Notes (Signed)
 Spoke to the rehab nurse at [email or web address] and gave report. Patients family is taking her home.

## 2024-06-04 NOTE — ED Provider Notes (Signed)
 [place] EMERGENCY DEPARTMENT AT [facility] Provider Note   CSN: [id number] Arrival date & time: 06/04/24  1207  An emergency department physician performed an initial assessment on this suspected stroke patient at 1207.  Patient presents with: Code Stroke   Katherine Porter is a 89 y.o. female past medical history significant for DVT not currently on blood thinners, meningioma, atrial fibrillation, subdural hematoma followed by neurosurgery who presented to the emergency department from facility as a code stroke secondary to aphasia.  History is obtained from EMS and family.  Patient's family members noted that during their visit the patient began experiencing aphasia that resolved on arrival.  Patient patient's family deny any recurrent falls, trauma. patient denies any current chest pain, shortness of breath, syncope, numbness or weakness of the extremities.    HPI     Prior to Admission medications   Medication Sig Start Date End Date Taking? Authorizing Provider  acetaminophen  (TYLENOL ) 500 MG tablet Take 1,000 mg by mouth in the morning, at noon, and at bedtime.  07/18/24  [provider]  atorvastatin (LIPITOR) 40 MG tablet Take 40 mg by mouth at bedtime. 05/14/24   [provider]  calcitonin, salmon, (MIACALCIN/FORTICAL) 200 UNIT/ACT nasal spray Place 1 spray into alternate nostrils daily. 05/19/24 07/18/24  [provider]  calcium  carbonate (TUMS) 500 MG chewable tablet Chew 1 tablet (200 mg of elemental calcium  total) by mouth daily before supper. 05/29/24 06/28/24  Rosario Eland I, MD  Cyanocobalamin 1000 MCG SUBL Take 1 tablet by mouth daily. 02/12/24 02/11/25  [provider]  cycloSPORINE  (RESTASIS ) 0.05 % ophthalmic emulsion Place 1 drop into both eyes 2 (two) times daily.    [provider]  diltiazem  (CARDIZEM  CD) 120 MG 24 hr capsule Take 120 mg by mouth daily. 05/12/17   [provider]  gabapentin  (NEURONTIN )  600 MG tablet Take 600 mg by mouth at bedtime. 12/18/17   [provider]  lidocaine  (LIDODERM ) 5 % Place 1 patch onto the skin daily. Remove & Discard patch within 12 hours or as directed by MD 06/01/24   Rosario Eland FERNS, MD  oxyCODONE  (OXY IR/ROXICODONE ) 5 MG immediate release tablet Take 1 tablet (5 mg total) by mouth every 6 (six) hours as needed for severe pain (pain score 7-10) or moderate pain (pain score 4-6) (acute surgical pain of the pelvis). 05/31/24   Rosario Eland I, MD  polyethylene glycol (MIRALAX / GLYCOLAX) 17 g packet Take 17 g by mouth daily. Take 17g by mouth once daily.  May take one additional dose as needed for constipation.    [provider]  senna-docusate (SENOKOT-S) 8.6-50 MG tablet Take 1 tablet by mouth at bedtime as needed for up to 15 days for moderate constipation. 05/29/24 06/13/24  Rosario Eland FERNS, MD    Allergies: Aspirin, Naproxen, Other, Tramadol, Wound dressing adhesive, Codeine, and Baclofen    Review of Systems  Updated Vital Signs BP 127/80   Pulse (!) 118   Temp 97.8 F (36.6 C) (Tympanic)   Resp (!) 22   Ht 5' 5 (1.651 m)   Wt 70.3 kg   SpO2 96%   BMI 25.79 kg/m   Physical Exam Vitals reviewed.  Constitutional:      General: She is not in acute distress.    Appearance: She is not ill-appearing.  HENT:     Head: Normocephalic and atraumatic.  Eyes:     General: No visual field deficit. Cardiovascular:  Rate and Rhythm: Normal rate and regular rhythm.     Heart sounds: Normal heart sounds. No murmur heard. Pulmonary:     Effort: Pulmonary effort is normal. No tachypnea.     Breath sounds: Normal breath sounds.  Musculoskeletal:     Right lower leg: No edema.     Left lower leg: No edema.  Neurological:     Mental Status: She is alert. Mental status is at baseline.     Cranial Nerves: No cranial nerve deficit, dysarthria or facial asymmetry.     Sensory: Sensation is intact. No sensory deficit.      Motor: Motor function is intact. No weakness, tremor, abnormal muscle tone, seizure activity or pronator drift.     Coordination: Coordination is intact. Finger-Nose-Finger Test normal.     Comments: Patient is oriented to person and place however not time which appears to be patient's baseline per family.  Psychiatric:        Behavior: Behavior is cooperative.     (all labs ordered are listed, but only abnormal results are displayed) Labs Reviewed  COMPREHENSIVE METABOLIC PANEL WITH GFR - Abnormal; Notable for the following components:      Result Value   CO2 21 (*)    Calcium  8.8 (*)    Total Protein 6.4 (*)    Albumin 3.1 (*)    Alkaline Phosphatase 188 (*)    All other components within normal limits  CBG MONITORING, ED - Abnormal; Notable for the following components:   Glucose-Capillary 107 (*)    All other components within normal limits  I-STAT CHEM 8, ED - Abnormal; Notable for the following components:   Calcium , Ion 1.13 (*)    TCO2 20 (*)    All other components within normal limits  PROTIME-INR  APTT  CBC  DIFFERENTIAL  ETHANOL  CBG MONITORING, ED    EKG: None  Radiology: CT HEAD CODE STROKE WO CONTRAST Result Date: 06/04/2024 EXAM: CT HEAD WITHOUT CONTRAST 06/04/2024 12:16:00 PM TECHNIQUE: CT of the head was performed without the administration of intravenous contrast. Automated exposure control, iterative reconstruction, and/or weight based adjustment of the mA/kV was utilized to reduce the radiation dose to as low as reasonably achievable. COMPARISON: CT of the head dated 05/31/2024. CLINICAL HISTORY: Neuro deficit, acute, stroke suspected. code stroke; CT HEAD CODE STROKE WO CONTRAST; Neuro deficit, acute, stroke suspected; Dr. Aron [phone] FINDINGS: BRAIN AND VENTRICLES: No acute hemorrhage. Gray-white differentiation is preserved. No hydrocephalus. No extra-axial collection. No mass effect or midline shift. There is moderate generalized cerebral volume loss.  There is a chronic lacunar infarct present within right thalamus anteriorly. There is mild-to-moderate periventricular and deep cerebral white matter disease. ORBITS: Patient is status post bilateral lens replacement. SINUSES: There is moderate mucosal disease again demonstrated within the right maxillary sinus. There is also mild mucosal disease within the ethmoid air cells and floor of the left maxillary sinus. SOFT TISSUES AND SKULL: There is a chronic subdural hematoma again seen overlying the left cerebral hemisphere which measures up to 7 mm in diameter. There is no evidence of interval hemorrhage. Vascular calcifications are present. The caring provider Dr. Sal Khaliqkina was notified by the [facility] paging service at 12:22 pm that a left subdural hematoma was present but there was no evidence of acute infarction. Aspect score is 10. IMPRESSION: 1. No acute intracranial abnormality. 2. Chronic left subdural hematoma, measuring up to 7 mm in diameter, without evidence of interval hemorrhage. 3. Chronic lacunar infarct in  the right thalamus anteriorly. 4. Moderate generalized cerebral volume loss. 5. Mild-to-moderate periventricular and deep cerebral white matter disease. Electronically signed by: evalene coho 06/04/2024 12:29 PM EDT RP Workstation: [id number]     Procedures   Medications Ordered in the ED  sodium chloride  flush (NS) 0.9 % injection 3 mL (3 mLs Intravenous Given 06/04/24 1247)                                    Medical Decision Making Amount and/or Complexity of Data Reviewed Labs: ordered. Radiology: ordered.   On initial evaluation patient is hemodynamically stable, afebrile and not in acute distress.  On patient arrival airway is patent, neurology at bedside.  Patient without evidence of focal neurologic deficits however will obtain CT imaging.  CT imaging obtained and reviewed by neurology with evidence of stable subdural hematoma and no further acute intracranial  abnormality.  On reevaluation patient remains hemodynamically stable without acute focal neurologic deficits.  Laboratory studies within normal limits without evidence of underlying infection, anemia, electrolyte abnormality, renal impairment.  As patient remains hemodynamically stable without focal neurologic deficits, do not believe patient's presentation is secondary to CVA.  Neurology with recommendations to follow-up in outpatient setting with primary care provider however believe patient is safe to be discharged at this time.  Patient was given strict return precautions and will be taken back to facility by family members.     Final diagnoses:  Slurred speech    ED Discharge Orders     None       Lavanda Bolster DO Emergency Medicine PGY2   Bolster Lavanda, DO 06/04/24 1635    Horton, Roxie HERO, DO 06/05/24 [phone]

## 2024-11-17 NOTE — Telephone Encounter (Signed)
 Copied from CRM #[id number]. Topic: Clinical Concerns - Medical Question >> Nov 17, 2024 10:31 AM Roselie NOVAK wrote: Whitney with [employer] is calling other request    Include all details related to the request(s) below:   Chronic Condition Request  Form to be faxed to office. Need completed and signed by PCP.  Office fax verified.   Confirm and type the Best Contact Number below:  Patient/caller contact number:          [phone]   [] Home  [] Mobile  [] Work [] Other   [] Okay to leave a voicemail   Medication List:  Current Outpatient Medications:    atorvastatin (LIPITOR) 40 mg tablet, Take 1 tablet (40 mg total) by mouth at bedtime., Disp: 90 tablet, Rfl: 3   calcitonin, salmon, (MIACALCIN) 200 unit/actuation nasal spray, Administer 1 spray into one nostril daily., Disp: , Rfl:    calcium  carbonate (Antacid, calcium  carbonate,) 500 mg (200 mg calcium ) chewable tablet, Chew 1 tablet daily., Disp: , Rfl:    chlorpheniramine (CHLOR-TRIMETRON) 4 mg tablet, Take 4 mg by mouth every 6 (six) hours as needed., Disp: , Rfl:    cholecalciferol (VITAMIN D3) 2,000 unit cap capsule, Take 1 each (2,000 Units total) by mouth daily., Disp: , Rfl:    cyanocobalamin (VITAMIN B12) 1,000 mcg tablet, Take 1 tablet (1,000 mcg total) by mouth daily., Disp: 90 tablet, Rfl: 3   cycloSPORINE  (RESTASIS ) 0.05 % ophthalmic emulsion, Administer 1 drop into both eyes 2 (two) times a day., Disp: 180 mL, Rfl: 3   dicyclomine (Bentyl) 20 mg tab, Take 20 mg by mouth 4 (four) times a day., Disp: , Rfl:    dilTIAZem  (TIAZAC ) 120 mg Cs24 24 hr capsule, TAKE 1 CAPSULE BY MOUTH EVERY DAY, Disp: 30 capsule, Rfl: 3   escitalopram (Lexapro) 5 mg tablet, Take 5 mg by mouth daily., Disp: , Rfl:    gabapentin  (NEURONTIN ) 100 mg capsule, Take 100 mg by mouth every morning., Disp: , Rfl:    gabapentin  (NEURONTIN ) 600 mg tablet, TAKE ONE TABLET BY MOUTH EVERY NIGHT AT BEDTIME, Disp: 30 tablet, Rfl: 0   lidocaine   (SALONPAS) 4 % patch, Apply 1 patch topically daily. Place on lower back, Disp: , Rfl:    polyethylene glycol (GLYCOLAX) 17 gram packet, Take 17 g by mouth daily., Disp: , Rfl:    rivaroxaban  (XARELTO ) 20 mg tablet, Take 1 tablet (20 mg total) by mouth daily with dinner starting 09/22/24 (Patient taking differently: Take 20 mg by mouth daily with dinner. Take 1 tablet (20 mg total) by mouth daily with dinner starting 09/22/24), Disp: , Rfl:    traZODone (DESYREL) 150 mg tablet, Take 75 mg by mouth at bedtime., Disp: , Rfl:      Medication Request/Refills: Pharmacy Information (if applicable)   [] Not Applicable       []  Pharmacy listed  Send Medication Request to:                                                 [] Pharmacy not listed (added to pharmacy list in Epic) Send Medication Request to:      Listed Pharmacies: No Pharmacies Listed
